# Patient Record
Sex: Female | Born: 1940 | Race: White | Hispanic: No | Marital: Single | Smoking: Never smoker
Health system: Southern US, Community
[De-identification: ages and names within clinical notes are randomized; demographics above are authoritative.]

## PROBLEM LIST (undated history)

## (undated) DIAGNOSIS — H269 Unspecified cataract: Secondary | ICD-10-CM

## (undated) DIAGNOSIS — I10 Essential (primary) hypertension: Secondary | ICD-10-CM

## (undated) DIAGNOSIS — E785 Hyperlipidemia, unspecified: Secondary | ICD-10-CM

## (undated) DIAGNOSIS — K635 Polyp of colon: Secondary | ICD-10-CM

## (undated) DIAGNOSIS — I739 Peripheral vascular disease, unspecified: Secondary | ICD-10-CM

## (undated) DIAGNOSIS — I219 Acute myocardial infarction, unspecified: Secondary | ICD-10-CM

## (undated) DIAGNOSIS — C4490 Unspecified malignant neoplasm of skin, unspecified: Secondary | ICD-10-CM

## (undated) DIAGNOSIS — M1990 Unspecified osteoarthritis, unspecified site: Secondary | ICD-10-CM

## (undated) DIAGNOSIS — I2510 Atherosclerotic heart disease of native coronary artery without angina pectoris: Secondary | ICD-10-CM

## (undated) DIAGNOSIS — Z8619 Personal history of other infectious and parasitic diseases: Secondary | ICD-10-CM

## (undated) DIAGNOSIS — F329 Major depressive disorder, single episode, unspecified: Secondary | ICD-10-CM

## (undated) DIAGNOSIS — E119 Type 2 diabetes mellitus without complications: Secondary | ICD-10-CM

## (undated) DIAGNOSIS — F32A Depression, unspecified: Secondary | ICD-10-CM

## (undated) DIAGNOSIS — N390 Urinary tract infection, site not specified: Secondary | ICD-10-CM

## (undated) HISTORY — DX: Peripheral vascular disease, unspecified: I73.9

## (undated) HISTORY — PX: CHOLECYSTECTOMY: SHX55

## (undated) HISTORY — DX: Major depressive disorder, single episode, unspecified: F32.9

## (undated) HISTORY — PX: CATARACT EXTRACTION: SUR2

## (undated) HISTORY — DX: Essential (primary) hypertension: I10

## (undated) HISTORY — DX: Urinary tract infection, site not specified: N39.0

## (undated) HISTORY — DX: Polyp of colon: K63.5

## (undated) HISTORY — DX: Type 2 diabetes mellitus without complications: E11.9

## (undated) HISTORY — DX: Hyperlipidemia, unspecified: E78.5

## (undated) HISTORY — PX: PERIPHERAL ARTERIAL STENT GRAFT: SHX2220

## (undated) HISTORY — DX: Depression, unspecified: F32.A

## (undated) HISTORY — DX: Personal history of other infectious and parasitic diseases: Z86.19

## (undated) HISTORY — PX: CORONARY ARTERY BYPASS GRAFT: SHX141

## (undated) HISTORY — DX: Unspecified malignant neoplasm of skin, unspecified: C44.90

## (undated) HISTORY — DX: Acute myocardial infarction, unspecified: I21.9

## (undated) HISTORY — DX: Unspecified cataract: H26.9

## (undated) HISTORY — DX: Unspecified osteoarthritis, unspecified site: M19.90

## (undated) HISTORY — DX: Atherosclerotic heart disease of native coronary artery without angina pectoris: I25.10

---

## 1999-11-21 HISTORY — PX: CARDIAC CATHETERIZATION: SHX172

## 2011-11-21 DIAGNOSIS — Z8619 Personal history of other infectious and parasitic diseases: Secondary | ICD-10-CM

## 2011-11-21 HISTORY — DX: Personal history of other infectious and parasitic diseases: Z86.19

## 2013-04-05 ENCOUNTER — Inpatient Hospital Stay: Payer: Self-pay | Admitting: Internal Medicine

## 2013-04-05 LAB — TROPONIN I: Troponin-I: <0.02 ng/mL

## 2013-04-05 LAB — URINALYSIS, COMPLETE
Bilirubin,UR: NEGATIVE
Glucose,UR: 50 mg/dL (ref 0–75)
Hyaline Cast: 1
Nitrite: NEGATIVE
Ph: 6 (ref 4.5–8.0)
RBC,UR: 7 /HPF (ref 0–5)
Specific Gravity: 1.011 (ref 1.003–1.030)
Squamous Epithelial: <1
WBC UR: 225 /HPF (ref 0–5)

## 2013-04-05 LAB — CK TOTAL AND CKMB (NOT AT ARMC): CK, Total: 112 U/L (ref 21–215)

## 2013-04-05 LAB — BASIC METABOLIC PANEL
Anion Gap: 9 (ref 7–16)
BUN: 29 mg/dL — ABNORMAL HIGH (ref 7–18)
Calcium, Total: 8.6 mg/dL (ref 8.5–10.1)
Chloride: 101 mmol/L (ref 98–107)
Co2: 25 mmol/L (ref 21–32)
EGFR (African American): 38 — ABNORMAL LOW
Glucose: 412 mg/dL — ABNORMAL HIGH (ref 65–99)
Potassium: 4.2 mmol/L (ref 3.5–5.1)
Sodium: 135 mmol/L — ABNORMAL LOW (ref 136–145)

## 2013-04-05 LAB — CBC
MCH: 29.8 pg (ref 26.0–34.0)
RDW: 15 % — ABNORMAL HIGH (ref 11.5–14.5)

## 2013-04-10 LAB — CULTURE, BLOOD (SINGLE)

## 2013-04-21 ENCOUNTER — Telehealth: Payer: Self-pay | Admitting: Family Medicine

## 2013-04-21 NOTE — Telephone Encounter (Signed)
Yes ok to accommodate sooner- not last appt of day please.

## 2013-04-21 NOTE — Telephone Encounter (Signed)
Kristina Mahoney DOB 12/05/1974, called on behalf of Kristina Mahoney, a friend who is currently living in her home.  Kristina Mahoney is scheduled for a New Patient appointment with you on 05/22/13 2 pm.  However, Kristina Mahoney is a diabetic and having some issues with a wound on her foot and was seen over the weekend at an Urgent Care facility and is also scheduled at a Wound Center for later this week.    Kristina Mahoney was hoping that you would be able to see [name] earlier than May 22, 2013.  Best number for Kristina Mahoney is [phone].

## 2013-04-23 NOTE — Telephone Encounter (Signed)
Appointment scheduled for 04/29/13.

## 2013-04-29 ENCOUNTER — Encounter: Payer: Self-pay | Admitting: Nurse Practitioner

## 2013-04-29 ENCOUNTER — Encounter: Payer: Self-pay | Admitting: Family Medicine

## 2013-04-29 ENCOUNTER — Ambulatory Visit: Payer: Self-pay | Admitting: Family Medicine

## 2013-04-29 ENCOUNTER — Encounter: Payer: Self-pay | Admitting: Cardiothoracic Surgery

## 2013-05-09 ENCOUNTER — Inpatient Hospital Stay: Payer: Self-pay | Admitting: Student

## 2013-05-09 LAB — URINALYSIS, COMPLETE
Leukocyte Esterase: NEGATIVE
Nitrite: NEGATIVE
Protein: NEGATIVE
RBC,UR: <1 /HPF (ref 0–5)
Specific Gravity: 1.011 (ref 1.003–1.030)
Squamous Epithelial: <1

## 2013-05-09 LAB — CBC
HCT: 33.7 % — ABNORMAL LOW (ref 35.0–47.0)
MCH: 29.4 pg (ref 26.0–34.0)
MCHC: 33.6 g/dL (ref 32.0–36.0)
MCV: 88 fL (ref 80–100)
Platelet: 385 10*3/uL (ref 150–440)
RDW: 16.1 % — ABNORMAL HIGH (ref 11.5–14.5)

## 2013-05-09 LAB — COMPREHENSIVE METABOLIC PANEL
Albumin: 3.5 g/dL (ref 3.4–5.0)
Alkaline Phosphatase: 169 U/L — ABNORMAL HIGH (ref 50–136)
BUN: 30 mg/dL — ABNORMAL HIGH (ref 7–18)
Calcium, Total: 9.6 mg/dL (ref 8.5–10.1)
Creatinine: 1.46 mg/dL — ABNORMAL HIGH (ref 0.60–1.30)
Glucose: 251 mg/dL — ABNORMAL HIGH (ref 65–99)
Potassium: 3.9 mmol/L (ref 3.5–5.1)
SGOT(AST): 27 U/L (ref 15–37)
SGPT (ALT): 20 U/L (ref 12–78)
Total Protein: 8.6 g/dL — ABNORMAL HIGH (ref 6.4–8.2)

## 2013-05-09 LAB — TROPONIN I: Troponin-I: <0.02 ng/mL

## 2013-05-10 LAB — BASIC METABOLIC PANEL
Co2: 25 mmol/L (ref 21–32)
EGFR (Non-African Amer.): 41 — ABNORMAL LOW
Osmolality: 286 (ref 275–301)
Sodium: 140 mmol/L (ref 136–145)

## 2013-05-10 LAB — CBC WITH DIFFERENTIAL/PLATELET
Basophil #: 0.1 10*3/uL (ref 0.0–0.1)
Basophil %: 1.2 %
Eosinophil %: 1.8 %
HCT: 29.3 % — ABNORMAL LOW (ref 35.0–47.0)
HGB: 9.9 g/dL — ABNORMAL LOW (ref 12.0–16.0)
Lymphocyte #: 1.4 10*3/uL (ref 1.0–3.6)
Lymphocyte %: 18.2 %
MCH: 29.3 pg (ref 26.0–34.0)
MCHC: 33.9 g/dL (ref 32.0–36.0)
Monocyte #: 0.7 x10 3/mm (ref 0.2–0.9)
Monocyte %: 9.7 %
Neutrophil #: 5.2 10*3/uL (ref 1.4–6.5)
Neutrophil %: 69.1 %
Platelet: 278 10*3/uL (ref 150–440)
RDW: 15.9 % — ABNORMAL HIGH (ref 11.5–14.5)
WBC: 7.5 10*3/uL (ref 3.6–11.0)

## 2013-05-10 LAB — MAGNESIUM: Magnesium: 2.2 mg/dL

## 2013-05-12 ENCOUNTER — Ambulatory Visit: Payer: Self-pay | Admitting: Family Medicine

## 2013-05-20 ENCOUNTER — Encounter: Payer: Self-pay | Admitting: Cardiothoracic Surgery

## 2013-05-20 ENCOUNTER — Encounter: Payer: Self-pay | Admitting: Nurse Practitioner

## 2013-05-22 ENCOUNTER — Ambulatory Visit: Payer: Self-pay | Admitting: Family Medicine

## 2013-06-02 ENCOUNTER — Ambulatory Visit (INDEPENDENT_AMBULATORY_CARE_PROVIDER_SITE_OTHER): Payer: Medicare Other | Admitting: Family Medicine

## 2013-06-02 ENCOUNTER — Encounter: Payer: Self-pay | Admitting: Family Medicine

## 2013-06-02 VITALS — BP 104/52 | HR 60 | Temp 97.7°F | Wt 158.0 lb

## 2013-06-02 DIAGNOSIS — L89600 Pressure ulcer of unspecified heel, unstageable: Secondary | ICD-10-CM

## 2013-06-02 DIAGNOSIS — E1165 Type 2 diabetes mellitus with hyperglycemia: Secondary | ICD-10-CM

## 2013-06-02 DIAGNOSIS — I25810 Atherosclerosis of coronary artery bypass graft(s) without angina pectoris: Secondary | ICD-10-CM

## 2013-06-02 DIAGNOSIS — L97409 Non-pressure chronic ulcer of unspecified heel and midfoot with unspecified severity: Secondary | ICD-10-CM

## 2013-06-02 DIAGNOSIS — L8995 Pressure ulcer of unspecified site, unstageable: Secondary | ICD-10-CM

## 2013-06-02 DIAGNOSIS — L8990 Pressure ulcer of unspecified site, unspecified stage: Secondary | ICD-10-CM

## 2013-06-02 DIAGNOSIS — E119 Type 2 diabetes mellitus without complications: Secondary | ICD-10-CM

## 2013-06-02 DIAGNOSIS — F32A Depression, unspecified: Secondary | ICD-10-CM

## 2013-06-02 DIAGNOSIS — E1142 Type 2 diabetes mellitus with diabetic polyneuropathy: Secondary | ICD-10-CM

## 2013-06-02 DIAGNOSIS — L89159 Pressure ulcer of sacral region, unspecified stage: Secondary | ICD-10-CM

## 2013-06-02 DIAGNOSIS — L97429 Non-pressure chronic ulcer of left heel and midfoot with unspecified severity: Secondary | ICD-10-CM | POA: Insufficient documentation

## 2013-06-02 DIAGNOSIS — I739 Peripheral vascular disease, unspecified: Secondary | ICD-10-CM | POA: Insufficient documentation

## 2013-06-02 DIAGNOSIS — L89152 Pressure ulcer of sacral region, stage 2: Secondary | ICD-10-CM | POA: Insufficient documentation

## 2013-06-02 DIAGNOSIS — L89609 Pressure ulcer of unspecified heel, unspecified stage: Secondary | ICD-10-CM | POA: Insufficient documentation

## 2013-06-02 DIAGNOSIS — L89109 Pressure ulcer of unspecified part of back, unspecified stage: Secondary | ICD-10-CM

## 2013-06-02 DIAGNOSIS — M1990 Unspecified osteoarthritis, unspecified site: Secondary | ICD-10-CM

## 2013-06-02 DIAGNOSIS — F329 Major depressive disorder, single episode, unspecified: Secondary | ICD-10-CM

## 2013-06-02 DIAGNOSIS — E785 Hyperlipidemia, unspecified: Secondary | ICD-10-CM

## 2013-06-02 DIAGNOSIS — E1159 Type 2 diabetes mellitus with other circulatory complications: Secondary | ICD-10-CM | POA: Insufficient documentation

## 2013-06-02 DIAGNOSIS — N390 Urinary tract infection, site not specified: Secondary | ICD-10-CM | POA: Insufficient documentation

## 2013-06-02 DIAGNOSIS — E1149 Type 2 diabetes mellitus with other diabetic neurological complication: Secondary | ICD-10-CM

## 2013-06-02 LAB — COMPREHENSIVE METABOLIC PANEL
ALT: 10 U/L (ref 0–35)
AST: 17 U/L (ref 0–37)
Albumin: 3.7 g/dL (ref 3.5–5.2)
Alkaline Phosphatase: 132 U/L — ABNORMAL HIGH (ref 39–117)
Potassium: 4.9 mEq/L (ref 3.5–5.1)
Total Protein: 7.3 g/dL (ref 6.0–8.3)

## 2013-06-02 LAB — LIPID PANEL
Cholesterol: 135 mg/dL (ref 0–200)
Total CHOL/HDL Ratio: 3
VLDL: 61.2 mg/dL — ABNORMAL HIGH (ref 0.0–40.0)

## 2013-06-02 MED ORDER — PREGABALIN 50 MG PO CAPS: 50.0000 mg | ORAL_CAPSULE | Freq: Two times a day (BID) | ORAL | Status: DC

## 2013-06-02 MED ORDER — ISOSORBIDE MONONITRATE ER 60 MG PO TB24: ORAL_TABLET | ORAL | Status: DC

## 2013-06-02 MED ORDER — METOPROLOL SUCCINATE ER 25 MG PO TB24: ORAL_TABLET | ORAL | Status: DC

## 2013-06-02 NOTE — Patient Instructions (Addendum)
Nice to meet you. Let's decrease Gabapentin to 300 mg at bedtime every other night for 5 days stop. Go ahead and start the Lyrica- 50 mg twice daily.  We are also decreasing your isosorbide to 60 mg twice daily.  Please stop by to see Shirlee Limerick on your way out to set up your referrals.  We will call you with your lab results.

## 2013-06-02 NOTE — Progress Notes (Signed)
Subjective:    Patient ID: Kristina Mahoney, female    DOB: 02-Aug-1941, 72 y.o.   MRN: [id number]  HPI Pleasant 72 yo female with complicated medical history here to establish care with her daughter.  Moved here from [place] two months ago with her daughter for the warmer weather.  They were hoping maybe it would help with her severe OA.  They have no other family.  Daughter is her caregiver.  Multiple pressure ulcers- followed by [facility].  Just received last note from 05/29/2013 from Cari Caraway, [place]. Bilateral hell ulcers and 1st metatarsal head wound along with sacral decub- currently using Iodoflex to keep all her wounds dry and stable and A & D on right buttock tears. Has home health for wound care.  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  Would like ortho referral to continue cortisone in injections in right knee. Unable to ambulate without a walker currently.  Daughter assists with sponge baths (currently not taking showers due to open wounds). Appetite has been down and losing weight.  H/o PVD- followed by Dr. Festus Barren at [facility].  ABI on left leg is 0.72.  DM- poorly controlled.  Per pt, last a1c 10.  Takes Lantus and Novolog but using sliding scale for both.  DM has been brittle.  Checks CBGs twice weekly- runs between 64-207.  No recent hypoglycemic episodes. Does have diabetic neuropathy.  Was on Gabapentin 300 mg three times daily but told her MD in [place] that it was no longer working.  Per pt daughter, advised to take 300 mg twice daily to wean down but no follow up instructions were given.  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 On Metoprolol 25 mg twice daily, Isosorbide 120 mg every morning and 60 mg in evening, ASA 81 mg daily. Was on Lasix 120 mg every morning and 80 mg in evening but that was stopped when she went to [facility] on 6/20 for weakness and hypotension! LE edema has not so far returned. [facility] notes reviewed.  Cr at  discharge was 1.46.  H/o C. Diff last spring after prolonged hospitalization.  She has been feeling a little "down" lately but she thinks this is due to the move.  Intermittent episodes of situational depression in her life previously and she does not currently feel depressed. Patient Active Problem List   Diagnosis Date Noted  . Poorly controlled diabetes mellitus 06/02/2013  . CAD (coronary artery disease) of artery bypass graft 06/02/2013  . OA (osteoarthritis) 06/02/2013  . PVD (peripheral vascular disease) 06/02/2013  . Pressure ulcer, heel 06/02/2013  . Ulcer of left heel 06/02/2013  . Sacral decubitus ulcer 06/02/2013  . Diabetic peripheral neuropathy 06/02/2013  . Hyperlipidemia   . Depression   . Recurrent UTI    Past Medical History  Diagnosis Date  . Diabetes mellitus without complication   . PVD (peripheral vascular disease)     Followed by Dr. Wyn Quaker.  ABI on left leg is 0.72 (05/2013)  . Arthritis   . CAD (coronary artery disease)   . Colon polyps   . History of Clostridium difficile colitis 2013  . Hypertension   . Hyperlipidemia   . Depression   . Recurrent UTI   . Cataract    Past Surgical History  Procedure Laterality Date  . Cholecystectomy    . Coronary artery bypass graft    . Cataract extraction     History  Substance Use Topics  . Smoking status: Never Smoker   . Smokeless tobacco: Not on file  . Alcohol Use: Not on file   Family History  Problem Relation Age of Onset  . Heart disease Mother   . Heart disease Father    Allergies  Allergen Reactions  . Tegretol (Carbamazepine) Nausea And Vomiting  . Trileptal (Oxcarbazepine) Nausea And Vomiting   No current outpatient prescriptions on file prior to visit.   No current facility-administered medications on file prior to visit.   The PMH, PSH, Social History, Family History, Medications, and allergies have been reviewed in [facility], and have been updated if relevant.      Review of  Systems See HPI No recent CP or SOB No blood in stool- does have h/o colon polyps +decreased appetite  No anxiety No SI or HI     Objective:   Physical Exam  Constitutional: She appears well-developed.  Non-toxic appearance. She has a sickly appearance. No distress.  HENT:  Head: Normocephalic and atraumatic.  Eyes: Conjunctivae and EOM are normal. Pupils are equal, round, and reactive to light.  Neck: No thyromegaly present.  Cardiovascular: Normal rate and regular rhythm.  PMI is not displaced.   Pulmonary/Chest: Effort normal and breath sounds normal.  Abdominal: Normal appearance and normal aorta.  Musculoskeletal:       Legs: Neurological: She is alert. No cranial nerve deficit or sensory deficit.  In wheelchair  Psychiatric: She has a normal mood and affect. Her speech is normal and behavior is normal. Judgment and thought content normal. Cognition and memory are normal.   BP 104/52  Pulse 60  Temp(Src) 97.7 F (36.5 C)  Wt 158 lb (71.668 kg)        Assessment & Plan:  1. Poorly controlled diabetes mellitus Recheck a1c today. I am concerned that she is using a sliding scale for both short and long acting insulin.  I do not feel comfortable with this management or how brittle her DM seems to be. Will refer to endocrinology (Dr. Elvera Lennox). Pt and her daughter agree with this plan. - Ambulatory referral to Endocrinology - Comprehensive metabolic panel - Hemoglobin A1c  2. CAD (coronary artery disease) of artery bypass graft S/p CABG On Metoprolol, ASA and Crestor. Will decrease Isosorbide to 60 mg twice daily given hypotension and dizziness. Also advised not to restart Lasix, certainly not at the doses she was previously taking. Refer to Dr. Mariah Milling as she needs to establish care with local cardiologist. - Ambulatory referral to Cardiology  3. OA (osteoarthritis) End stage.  Refer to ortho for cortisone injections and management. Will also refer for home health  PT. - Ambulatory referral to Orthopedic Surgery - Ambulatory referral to Home Health  4. PVD (peripheral vascular disease) Sees Dr. Wyn Quaker.  5. Pressure ulcer, heel, unspecified laterality, unstageable Followed by home health RN as well as wound care center. Advised increasing protein intake. - Ambulatory referral to Home Health  6. Ulcer of left heel, with unspecified severity See above. - Ambulatory referral to Home Health  7. Sacral decubitus ulcer, unspecified pressure ulcer stage See above.  8. Hyperlipidemia Continue crestor at current dose. Recheck lipid panel and liver function today. - Lipid Panel  9. Depression Currently stable but given home stressors, continue to monitor.  10. Diabetic peripheral neuropathy Poorly controlled.  Wean off gabapentin and start lyrica 50 mg twice daily. Follow up in 1 -2 months. The patient indicates understanding of these issues and agrees with the  plan.

## 2013-06-03 ENCOUNTER — Telehealth: Payer: Self-pay | Admitting: *Deleted

## 2013-06-03 NOTE — Telephone Encounter (Signed)
Patient's daughter advised.  

## 2013-06-03 NOTE — Telephone Encounter (Signed)
Message copied by Annamarie Major on Tue Jun 03, 2013  4:22 PM ------      Message from: Dianne Dun      Created: Tue Jun 03, 2013 12:44 PM       I somehow messed up her result note and now i am not able to reacess it (user error).  Please let pt know that cholesterol looks good, a1c improved at 8.0.  Keep appt with specialists as discussed.      Thanks,      Jovita Gamma ------

## 2013-06-06 ENCOUNTER — Other Ambulatory Visit: Payer: Self-pay

## 2013-06-06 MED ORDER — ROSUVASTATIN CALCIUM 40 MG PO TABS: 40.0000 mg | ORAL_TABLET | Freq: Every day | ORAL | Status: DC

## 2013-06-06 MED ORDER — HYDROCODONE-ACETAMINOPHEN 5-325 MG PO TABS: ORAL_TABLET | ORAL | Status: DC

## 2013-06-06 NOTE — Telephone Encounter (Signed)
pts daughter, Eunice Blase request refill Hydrocodone apap for leg neuropathy and Crestor to [facility]. Dr Dayton Martes has never prescribed before.Please advise.

## 2013-06-06 NOTE — Telephone Encounter (Signed)
[facility] 

## 2013-06-16 ENCOUNTER — Other Ambulatory Visit: Payer: Self-pay

## 2013-06-16 ENCOUNTER — Telehealth: Payer: Self-pay | Admitting: Family Medicine

## 2013-06-16 ENCOUNTER — Encounter: Payer: Self-pay | Admitting: Family Medicine

## 2013-06-16 MED ORDER — GLUCOSE BLOOD VI STRP: ORAL_STRIP | Status: AC

## 2013-06-16 MED ORDER — HYDROCODONE-ACETAMINOPHEN 5-325 MG PO TABS: ORAL_TABLET | ORAL | Status: DC

## 2013-06-16 MED ORDER — GLUCOSE BLOOD VI STRP: ORAL_STRIP | Status: DC

## 2013-06-16 NOTE — Telephone Encounter (Signed)
[facility] faxed request for Contour test strip with directions ck blood sugar 4 x daily as needed.(added to med list)Please advise.

## 2013-06-16 NOTE — Telephone Encounter (Signed)
[name] is asking if patient can have a 30 days supply, #90.  Please advise.

## 2013-06-20 ENCOUNTER — Encounter: Payer: Self-pay | Admitting: Nurse Practitioner

## 2013-06-20 MED ORDER — HYDROCODONE-ACETAMINOPHEN 5-325 MG PO TABS: ORAL_TABLET | ORAL | Status: DC

## 2013-06-20 NOTE — Telephone Encounter (Signed)
Yes

## 2013-06-20 NOTE — Telephone Encounter (Addendum)
Pt left v/m checking on status of Hydrocodone apap refill to [facility]; pt request cb. Was # 90 approved and called to pharmacy?

## 2013-06-20 NOTE — Telephone Encounter (Signed)
OK to call in #90?

## 2013-06-20 NOTE — Addendum Note (Signed)
Addended by: Eliezer Bottom on: 06/20/2013 03:18 PM   Modules accepted: Orders

## 2013-06-20 NOTE — Telephone Encounter (Signed)
Refill called to pharmacy.  Pt advised via my chart.

## 2013-06-23 ENCOUNTER — Ambulatory Visit (INDEPENDENT_AMBULATORY_CARE_PROVIDER_SITE_OTHER): Payer: Medicare Other | Admitting: Cardiovascular Disease

## 2013-06-23 ENCOUNTER — Encounter: Payer: Self-pay | Admitting: Cardiovascular Disease

## 2013-06-23 ENCOUNTER — Telehealth: Payer: Self-pay

## 2013-06-23 VITALS — BP 148/77 | HR 62 | Ht 65.0 in | Wt 158.0 lb

## 2013-06-23 DIAGNOSIS — E785 Hyperlipidemia, unspecified: Secondary | ICD-10-CM

## 2013-06-23 DIAGNOSIS — R0600 Dyspnea, unspecified: Secondary | ICD-10-CM

## 2013-06-23 DIAGNOSIS — I25810 Atherosclerosis of coronary artery bypass graft(s) without angina pectoris: Secondary | ICD-10-CM

## 2013-06-23 DIAGNOSIS — I441 Atrioventricular block, second degree: Secondary | ICD-10-CM

## 2013-06-23 DIAGNOSIS — R0609 Other forms of dyspnea: Secondary | ICD-10-CM

## 2013-06-23 NOTE — Progress Notes (Signed)
HPI  This is a 72 year old female who was referred by Dr. Dayton Martes for management of coronary artery disease. She is accompanied today by her daughter. She moved here from [place] in May in order to live with ay her daughter and be in a warmer weather with the hope of improving her severe OA.  She has multiple chronic medical conditions. She is known to have coronary artery disease and underwent CABG in 2006. She reports having cardiac catheterization done a few years post CABG which showed two out of three grafts to be occluded.  She has been told in the past that have heart condition was not strong enough to tolerate noncardiac surgeries.  She currently denies any chest pain. She does complain of dyspnea and lower extremity edema. Functional capacity is very limited due to severe osteoarthritis. She uses a walker and frequently a wheelchair.   she has multiple pressure ulcers  and nonhealing ulcer on the left foot. She is scheduled for lower extremity angiogram with Dr. Wyn Quaker next week.  She has prolonged history of orally controlled diabetes.  she was hospitalized recently at [facility] for weakness with hypotension. The dose of metoprolol was decreased to 25 mg twice daily. The dose of Imdur was decreased to 60 mg once daily. She was taken off Lasix.  She was on Lasix 120 mg every morning and 80 mg in evening . Cr at discharge was 1.46. She has chronic lower extremity edema worse on the left side with no significant worsening since she was taken off Lasix    Allergies  Allergen Reactions  . Tegretol (Carbamazepine) Nausea And Vomiting  . Trileptal (Oxcarbazepine) Nausea And Vomiting     Current Outpatient Prescriptions on File Prior to Visit  Medication Sig Dispense Refill  . aspirin 81 MG tablet Take 81 mg by mouth daily.      Marland Kitchen glucose blood (BAYER CONTOUR TEST) test strip Check blood sugar 4 x daily as needed.  Dx 250.00  150 each  5  . HYDROcodone-acetaminophen (NORCO/VICODIN) 5-325 MG per tablet Take  one by mouth three times a day  90 tablet  0  . insulin aspart (NOVOLOG) 100 UNIT/ML injection Use sliding scale if glucose over 150      . insulin glargine (LANTUS) 100 UNIT/ML injection Inject 15 units daily      . isosorbide mononitrate (IMDUR) 60 MG 24 hr tablet 1 tablet by mouth twice daily.  60 tablet  3  . metoprolol succinate (TOPROL-XL) 25 MG 24 hr tablet Take one by mouth two times a day  60 tablet  5  . potassium chloride (K-DUR,KLOR-CON) 10 MEQ tablet Take 10 mEq by mouth 2 (two) times daily.      . pregabalin (LYRICA) 50 MG capsule Take 1 capsule (50 mg total) by mouth 2 (two) times daily.  60 capsule  3  . rosuvastatin (CRESTOR) 40 MG tablet Take 1 tablet (40 mg total) by mouth daily.  90 tablet  1   No current facility-administered medications on file prior to visit.     Past Medical History  Diagnosis Date  . Diabetes mellitus without complication   . PVD (peripheral vascular disease)     Followed by Dr. Wyn Quaker.  ABI on left leg is 0.72 (05/2013)  . Arthritis   . CAD (coronary artery disease)   . Colon polyps   . History of Clostridium difficile colitis 2013  . Hypertension   . Hyperlipidemia   . Depression   .  Recurrent UTI   . Cataract   . MI (myocardial infarction)   . Skin cancer      Past Surgical History  Procedure Laterality Date  . Cholecystectomy    . Cataract extraction    . Cardiac catheterization  2001    [place]; [facility];   . Coronary artery bypass graft      [facility] [place]     Family History  Problem Relation Age of Onset  . Heart disease Mother   . Heart disease Father      History   Social History  . Marital Status: Single    Spouse Name: N/A    Number of Children: N/A  . Years of Education: N/A   Occupational History  . Not on file.   Social History Main Topics  . Smoking status: Never Smoker   . Smokeless tobacco: Not on file  . Alcohol Use: No  . Drug Use: No  . Sexually Active: Not on  file   Other Topics Concern  . Not on file   Social History Narrative   Dependent on her daughter for care, including complicated wound dressings.   Daughter seems very attentive.      Desires CPR, does not want long term life support.     ROS 10 point review of system was performed. It's negative other than what's mentioned in the history of present illness.    PHYSICAL EXAM   BP 148/77  Pulse 62  Ht 5\' 5"  (1.651 m)  Wt 158 lb (71.668 kg)  BMI 26.29 kg/m2 Constitutional: She is oriented to person, place, and time. She appears well-developed and well-nourished. No distress.  HENT: No nasal discharge.  Head: Normocephalic and atraumatic.  Eyes: Pupils are equal and round. Right eye exhibits no discharge. Left eye exhibits no discharge.  Neck: Normal range of motion. Neck supple. No JVD present. No thyromegaly present.  Cardiovascular: Normal rate, regular rhythm, normal heart sounds. Exam reveals no gallop and no friction rub. No murmur heard.  Pulmonary/Chest: Effort normal and breath sounds normal. No stridor. No respiratory distress. She has no wheezes. She has no rales. She exhibits no tenderness.  Abdominal: Soft. Bowel sounds are normal. She exhibits no distension. There is no tenderness. There is no rebound and no guarding.  Musculoskeletal: Normal range of motion. She exhibits mild edema worse on the left side and no tenderness.  Neurological: She is alert and oriented to person, place, and time. Coordination normal.  Skin: Skin is warm and dry. Chronic stasis dermatitis. She is not diaphoretic. No erythema. No pallor.  Psychiatric: She has a normal mood and affect. Her behavior is normal. Judgment and thought content normal.     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   ABNORMAL    ASSESSMENT AND PLAN

## 2013-06-23 NOTE — Assessment & Plan Note (Addendum)
She has no symptoms suggestive of angina. Her functional capacity is very poor related to severe osteoarthritis. Continue medical therapy.  I will obtain an echocardiogram to evaluate LV systolic function. She used to be on high-dose Lasix which was discontinued recently due to hypotension and volume depletion. I suspect that she will require at least a smaller dose of Lasix as maintenance.

## 2013-06-23 NOTE — Telephone Encounter (Signed)
Pt daughter called back .Marland KitchenMetroprolol Tarta 50 mg is what pt used to take, now pt is now on Metroprolol Succer 25 mg.

## 2013-06-23 NOTE — Assessment & Plan Note (Signed)
I will likely gradually decrease the dose of Toprol given this finding as well as right bundle branch block and left anterior fascicular block. Currently she seems to be asymptomatic.

## 2013-06-23 NOTE — Assessment & Plan Note (Signed)
Continue lifelong treatment with a statin. Lab Results  Component Value Date   CHOL 135 06/02/2013   HDL 38.60* 06/02/2013   LDLDIRECT 54.5 06/02/2013   TRIG 306.0* 06/02/2013   CHOLHDL 3 06/02/2013

## 2013-06-23 NOTE — Patient Instructions (Addendum)
Your physician has requested that you have an echocardiogram. Echocardiography is a painless test that uses sound waves to create images of your heart. It provides your doctor with information about the size and shape of your heart and how well your heart's chambers and valves are working. This procedure takes approximately one hour. There are no restrictions for this procedure.  Continue same medications but let us know about the kind of Metoprolol you are taking.   Follow up in 3 months.

## 2013-06-25 ENCOUNTER — Other Ambulatory Visit: Payer: Self-pay

## 2013-06-30 ENCOUNTER — Ambulatory Visit: Payer: Self-pay | Admitting: Vascular Surgery

## 2013-06-30 LAB — BASIC METABOLIC PANEL
Anion Gap: 8 (ref 7–16)
BUN: 18 mg/dL (ref 7–18)
Calcium, Total: 9 mg/dL (ref 8.5–10.1)
Chloride: 108 mmol/L — ABNORMAL HIGH (ref 98–107)
Co2: 23 mmol/L (ref 21–32)
Glucose: 229 mg/dL — ABNORMAL HIGH (ref 65–99)
Osmolality: 287 (ref 275–301)

## 2013-07-02 ENCOUNTER — Other Ambulatory Visit: Payer: Self-pay | Admitting: *Deleted

## 2013-07-02 MED ORDER — POTASSIUM CHLORIDE CRYS ER 10 MEQ PO TBCR: 10.0000 meq | EXTENDED_RELEASE_TABLET | Freq: Two times a day (BID) | ORAL | Status: DC

## 2013-07-03 ENCOUNTER — Other Ambulatory Visit: Payer: Self-pay

## 2013-07-03 ENCOUNTER — Other Ambulatory Visit (INDEPENDENT_AMBULATORY_CARE_PROVIDER_SITE_OTHER): Payer: Medicare Other

## 2013-07-03 ENCOUNTER — Other Ambulatory Visit: Payer: Self-pay | Admitting: Family Medicine

## 2013-07-03 DIAGNOSIS — R0989 Other specified symptoms and signs involving the circulatory and respiratory systems: Secondary | ICD-10-CM

## 2013-07-03 DIAGNOSIS — I25810 Atherosclerosis of coronary artery bypass graft(s) without angina pectoris: Secondary | ICD-10-CM

## 2013-07-03 DIAGNOSIS — R0600 Dyspnea, unspecified: Secondary | ICD-10-CM

## 2013-07-04 ENCOUNTER — Encounter: Payer: Self-pay | Admitting: Cardiothoracic Surgery

## 2013-07-08 ENCOUNTER — Telehealth: Payer: Self-pay

## 2013-07-08 NOTE — Telephone Encounter (Signed)
Pt informed of results.

## 2013-07-08 NOTE — Telephone Encounter (Signed)
Message copied by Marilynne Halsted on Tue Jul 08, 2013  8:45 AM ------      Message from: Lorine Bears A      Created: Mon Jul 07, 2013  1:45 PM       Inform patient that echo was fine. Normal EF with only mild mitral regurgitation. ------

## 2013-07-21 ENCOUNTER — Encounter: Payer: Self-pay | Admitting: Cardiothoracic Surgery

## 2013-07-22 ENCOUNTER — Telehealth: Payer: Self-pay | Admitting: *Deleted

## 2013-07-22 NOTE — Telephone Encounter (Signed)
Home health nurse called to ask if ok if she collects a urine on the patient.  Patient is complaining of pain and odor, says she gets about 4 UTI's a year.  I told her ok, she will leave cup for daughter to collect urine with and daughter will bring sample in to office.

## 2013-07-22 NOTE — Telephone Encounter (Signed)
Noted  

## 2013-07-23 ENCOUNTER — Other Ambulatory Visit: Payer: Self-pay | Admitting: Family Medicine

## 2013-07-23 ENCOUNTER — Ambulatory Visit (INDEPENDENT_AMBULATORY_CARE_PROVIDER_SITE_OTHER): Payer: Medicare Other | Admitting: Family Medicine

## 2013-07-23 DIAGNOSIS — R300 Dysuria: Secondary | ICD-10-CM

## 2013-07-23 LAB — POCT URINALYSIS DIPSTICK
Blood, UA: NEGATIVE
Ketones, UA: NEGATIVE
Spec Grav, UA: 1.02
pH, UA: 6

## 2013-07-23 MED ORDER — HYDROCODONE-ACETAMINOPHEN 5-325 MG PO TABS: ORAL_TABLET | ORAL | Status: DC

## 2013-07-24 NOTE — Progress Notes (Signed)
  Subjective:    Patient ID: Kristina Mahoney, female    DOB: [birth year], 72 y.o.   MRN: [id number]  HPI  [name] dropped off urine specimen for UA.  Review of Systems     Objective:   Physical Exam        Assessment & Plan:

## 2013-07-24 NOTE — Telephone Encounter (Signed)
[facility] 

## 2013-07-25 LAB — URINE CULTURE

## 2013-07-28 ENCOUNTER — Other Ambulatory Visit: Payer: Self-pay | Admitting: *Deleted

## 2013-07-28 MED ORDER — CIPROFLOXACIN HCL 250 MG PO TABS: 250.0000 mg | ORAL_TABLET | Freq: Two times a day (BID) | ORAL | Status: DC

## 2013-08-04 ENCOUNTER — Telehealth: Payer: Self-pay

## 2013-08-04 ENCOUNTER — Ambulatory Visit (INDEPENDENT_AMBULATORY_CARE_PROVIDER_SITE_OTHER): Payer: Medicare Other | Admitting: Family Medicine

## 2013-08-04 ENCOUNTER — Encounter: Payer: Self-pay | Admitting: Family Medicine

## 2013-08-04 VITALS — BP 124/64 | HR 61 | Temp 98.3°F | Ht 65.0 in | Wt 184.8 lb

## 2013-08-04 DIAGNOSIS — N390 Urinary tract infection, site not specified: Secondary | ICD-10-CM

## 2013-08-04 LAB — POCT UA - MICROSCOPIC ONLY: Bacteria, U Microscopic: POSITIVE

## 2013-08-04 LAB — POCT URINALYSIS DIPSTICK
Bilirubin, UA: NEGATIVE
Glucose, UA: NEGATIVE
Ketones, UA: NEGATIVE
Nitrite, UA: NEGATIVE

## 2013-08-04 MED ORDER — CIPROFLOXACIN HCL 250 MG PO TABS: 250.0000 mg | ORAL_TABLET | Freq: Two times a day (BID) | ORAL | Status: DC

## 2013-08-04 NOTE — Assessment & Plan Note (Signed)
S/p tx of proteus uti with cipro (5 d course) on 9/3- and symptoms started to re occur 2 d after finishing med  ua dip and micro are pos  Will px cipro 250 bid for 7 days  Pt could not give enough sample for cx today - if her family can bring a specimen back before starting first dose this afternoon they will for cx  Adv to update if worse or not imp in several days

## 2013-08-04 NOTE — Telephone Encounter (Signed)
Kristina Mahoney pts daughter said pt has finished Cipro and pt still has frequency of urine with discomfort when urinates and has h/a and vomited x 1  today. Request refill of antibiotic or should pt be seen. Pt has been hospitalized in the past for UTI x 2. Kristina Mahoney scheduled appt with Dr Milinda Antis today at 12 noon. (Dr Dayton Martes has no available appts today).

## 2013-08-04 NOTE — Patient Instructions (Addendum)
Start back on cipro  If you can bring more urine in for culture before the first dose - please do so  If symptoms worsen- please call  Make sure to drink lots of water

## 2013-08-04 NOTE — Telephone Encounter (Signed)
Will see her then 

## 2013-08-04 NOTE — Progress Notes (Signed)
Subjective:    Patient ID: Kristina Mahoney, female    DOB: 1941-06-04, 72 y.o.   MRN: [id number]  HPI 72 yo pt of Dr Dayton Martes here with urinary symptoms  She had pos ua / cx for proteus on 9/3 and was tx with cipro (this was pan sensitive)  Felt a little better and then she got worse  Sunday night - started feeling nausea and some chills  Also urinating quite often last night  Sugar 151 this am  Some bladder pressure and burning to urinate now  Some flank discomfort on R side  No blood in urine  No hx of kidney stones    Nausea  Vomited when she got out of the car  She does have frequent uti (3-4 a year)   She is done with the cipro-- finished it on Friday   Failed to be able to give specimen today Then tried again and most of urine missed the collection device- just enough to dip test   Patient Active Problem List   Diagnosis Date Noted  . Mobitz type 1 second degree AV block 06/23/2013  . Poorly controlled diabetes mellitus 06/02/2013  . CAD (coronary artery disease) of artery bypass graft 06/02/2013  . OA (osteoarthritis) 06/02/2013  . PVD (peripheral vascular disease) 06/02/2013  . Pressure ulcer, heel 06/02/2013  . Ulcer of left heel 06/02/2013  . Sacral decubitus ulcer 06/02/2013  . Diabetic peripheral neuropathy 06/02/2013  . Hyperlipidemia   . Depression   . Recurrent UTI    Past Medical History  Diagnosis Date  . Diabetes mellitus without complication   . PVD (peripheral vascular disease)     Followed by Dr. Wyn Quaker.  ABI on left leg is 0.72 (05/2013)  . Arthritis   . CAD (coronary artery disease)   . Colon polyps   . History of Clostridium difficile colitis 2013  . Hypertension   . Hyperlipidemia   . Depression   . Recurrent UTI   . Cataract   . MI (myocardial infarction)   . Skin cancer    Past Surgical History  Procedure Laterality Date  . Cholecystectomy    . Cataract extraction    . Cardiac catheterization  2001    [place];   .  Coronary artery bypass graft      [facility] [place]   History  Substance Use Topics  . Smoking status: Never Smoker   . Smokeless tobacco: Not on file  . Alcohol Use: No   Family History  Problem Relation Age of Onset  . Heart disease Mother   . Heart disease Father    Allergies  Allergen Reactions  . Tegretol [Carbamazepine] Nausea And Vomiting  . Trileptal [Oxcarbazepine] Nausea And Vomiting   Current Outpatient Prescriptions on File Prior to Visit  Medication Sig Dispense Refill  . aspirin 81 MG tablet Take 81 mg by mouth daily.      Marland Kitchen glucose blood (BAYER CONTOUR TEST) test strip Check blood sugar 4 x daily as needed.  Dx 250.00  150 each  5  . HYDROcodone-acetaminophen (NORCO/VICODIN) 5-325 MG per tablet Take one by mouth three times a day  90 tablet  0  . insulin aspart (NOVOLOG) 100 UNIT/ML injection Use sliding scale if glucose over 150      . insulin glargine (LANTUS) 100 UNIT/ML injection Inject 15 units daily      . isosorbide mononitrate (IMDUR) 60 MG 24 hr tablet 1 tablet by mouth twice  daily.  60 tablet  3  . metoprolol succinate (TOPROL-XL) 25 MG 24 hr tablet Take one by mouth two times a day  60 tablet  5  . potassium chloride (K-DUR,KLOR-CON) 10 MEQ tablet TAKE 2 TABLETS BY MOUTH EVERY DAY  60 tablet  2  . pregabalin (LYRICA) 50 MG capsule Take 1 capsule (50 mg total) by mouth 2 (two) times daily.  60 capsule  3  . rosuvastatin (CRESTOR) 40 MG tablet Take 1 tablet (40 mg total) by mouth daily.  90 tablet  1   No current facility-administered medications on file prior to visit.    Review of Systems Review of Systems  Constitutional: Negative for fever, appetite change,  and unexpected weight change. pos for fatigue and chills  Eyes: Negative for pain and visual disturbance.  Respiratory: Negative for cough and shortness of breath.   Cardiovascular: Negative for cp or palpitations    Gastrointestinal: Negative for  diarrhea and constipation. neg  for abd pain  Genitourinary: pos for  urgency and frequency. neg for hematuria  Skin: Negative for pallor or rash   Neurological: Negative for weakness, light-headedness, numbness and headaches.  Hematological: Negative for adenopathy. Does not bruise/bleed easily.  Psychiatric/Behavioral: Negative for dysphoric mood. The patient is not nervous/anxious.         Objective:   Physical Exam  Constitutional: She appears well-developed and well-nourished. No distress.  Frail appearing elderly female in a wheelchair  HENT:  Head: Normocephalic and atraumatic.  Mouth/Throat: Oropharynx is clear and moist.  Eyes: Conjunctivae and EOM are normal. Pupils are equal, round, and reactive to light.  Neck: Normal range of motion. Neck supple.  Cardiovascular: Normal rate and regular rhythm.   Pulmonary/Chest: Effort normal and breath sounds normal. No respiratory distress. She has no rales.  Abdominal: Soft. Bowel sounds are normal. She exhibits no distension and no mass. There is tenderness in the suprapubic area. There is CVA tenderness. There is no rebound and no guarding.  Very slight R flank tenderness   Musculoskeletal:  L foot is wrapped in a bandage   Lymphadenopathy:    She has no cervical adenopathy.  Neurological: She is alert.  Skin: Skin is warm and dry. No rash noted.  Psychiatric: She has a normal mood and affect.          Assessment & Plan:

## 2013-08-06 ENCOUNTER — Ambulatory Visit: Payer: Medicare Other

## 2013-08-08 ENCOUNTER — Emergency Department: Payer: Self-pay | Admitting: Emergency Medicine

## 2013-08-08 LAB — BASIC METABOLIC PANEL
Anion Gap: 7 (ref 7–16)
BUN: 16 mg/dL (ref 7–18)
Calcium, Total: 9.1 mg/dL (ref 8.5–10.1)
Chloride: 105 mmol/L (ref 98–107)
Creatinine: 0.89 mg/dL (ref 0.60–1.30)
EGFR (Non-African Amer.): >60
Glucose: 212 mg/dL — ABNORMAL HIGH (ref 65–99)
Osmolality: 278 (ref 275–301)

## 2013-08-08 LAB — URINALYSIS, COMPLETE
Bilirubin,UR: NEGATIVE
Ph: 5 (ref 4.5–8.0)
Protein: 100
RBC,UR: 106 /HPF (ref 0–5)

## 2013-08-08 LAB — CBC
HCT: 38.8 % (ref 35.0–47.0)
MCHC: 33.7 g/dL (ref 32.0–36.0)
MCV: 90 fL (ref 80–100)
RBC: 4.3 10*6/uL (ref 3.80–5.20)
WBC: 9.6 10*3/uL (ref 3.6–11.0)

## 2013-08-12 ENCOUNTER — Telehealth: Payer: Self-pay

## 2013-08-12 NOTE — Telephone Encounter (Signed)
Christy nurse with [facility] left v/m pt was seen 08/04/13 with ongoing UTI; pt no better, no appetite,very nauseated, no fever; Christy request cb.

## 2013-08-12 NOTE — Telephone Encounter (Signed)
Left message for pt to return call.

## 2013-08-12 NOTE — Telephone Encounter (Signed)
It looks like she was given a longer course of cipro by Dr. Milinda Antis.  No urine cx (Perhaps inadequate specimen).  Could she leave another sample for [place] or come back to be evaluated?

## 2013-08-13 ENCOUNTER — Ambulatory Visit (INDEPENDENT_AMBULATORY_CARE_PROVIDER_SITE_OTHER): Payer: Medicare Other | Admitting: Internal Medicine

## 2013-08-13 ENCOUNTER — Encounter: Payer: Self-pay | Admitting: Internal Medicine

## 2013-08-13 VITALS — BP 112/60 | HR 71 | Temp 97.4°F | Resp 12 | Ht 65.0 in | Wt 136.0 lb

## 2013-08-13 DIAGNOSIS — E1159 Type 2 diabetes mellitus with other circulatory complications: Secondary | ICD-10-CM

## 2013-08-13 NOTE — Patient Instructions (Addendum)
Please use the following insulin regimen: - Lantus 15 units at night - Novolog before every meal (2x a day): - 3 units with a small meal - 4 units with a larger meal - Add the following Sliding scale: - 150-175: + 1 unit  - 176-200: + 2 units  - >200: + 3 units  Pleae check sugars 2-4 x a day and write them down in the log. Please return in 1 month with your sugar log.   PATIENT INSTRUCTIONS FOR TYPE 2 DIABETES:  DIET AND EXERCISE Diet and exercise is an important part of diabetic treatment.  We recommended aerobic exercise in the form of brisk walking (working between 40-60% of maximal aerobic capacity, similar to brisk walking) for 150 minutes per week (such as 30 minutes five days per week) along with 3 times per week performing 'resistance' training (using various gauge rubber tubes with handles) 5-10 exercises involving the major muscle groups (upper body, lower body and core) performing 10-15 repetitions (or near fatigue) each exercise. Start at half the above goal but build slowly to reach the above goals. If limited by weight, joint pain, or disability, we recommend daily walking in a swimming pool with water up to waist to reduce pressure from joints while allow for adequate exercise.    BLOOD GLUCOSES Monitoring your blood glucoses is important for continued management of your diabetes. Please check your blood glucoses 2-4 times a day: fasting, before meals and at bedtime (you can rotate these measurements - e.g. one day check before the 3 meals, the next day check before 2 of the meals and before bedtime, etc.   HYPOGLYCEMIA (low blood sugar) Hypoglycemia is usually a reaction to not eating, exercising, or taking too much insulin/ other diabetes drugs.  Symptoms include tremors, sweating, hunger, confusion, headache, etc. Treat IMMEDIATELY with 15 grams of Carbs:   4 glucose tablets    cup regular juice/soda   2 tablespoons raisins   4 teaspoons sugar   1 tablespoon  honey Recheck blood glucose in 15 mins and repeat above if still symptomatic/blood glucose <100. Please contact our office at [phone] if you have questions about how to next handle your insulin.  RECOMMENDATIONS TO REDUCE YOUR RISK OF DIABETIC COMPLICATIONS: * Take your prescribed MEDICATION(S). * Follow a DIABETIC diet: Complex carbs, fiber rich foods, heart healthy fish twice weekly, (monounsaturated and polyunsaturated) fats * AVOID saturated/trans fats, high fat foods, >2,300 mg salt per day. * EXERCISE at least 5 times a week for 30 minutes or preferably daily.  * DO NOT SMOKE OR DRINK more than 1 drink a day. * Check your FEET every day. Do not wear tightfitting shoes. Contact us if you develop an ulcer * See your EYE doctor once a year or more if needed * Get a FLU shot once a year * Get a PNEUMONIA vaccine once before and once after age 17 years  GOALS:  * Your Hemoglobin A1c of <7%  * fasting sugars need to be <130 * after meals sugars need to be <180 (2h after you start eating) * Your Systolic BP should be 140 or lower  * Your Diastolic BP should be 80 or lower  * Your HDL (Good Cholesterol) should be 40 or higher  * Your LDL (Bad Cholesterol) should be 100 or lower  * Your Triglycerides should be 150 or lower  * Your Urine microalbumin (kidney function) should be <30 * Your Body Mass Index should be 25 or lower  We will be glad to help you achieve these goals. Our telephone number is: [phone].

## 2013-08-13 NOTE — Progress Notes (Signed)
Patient ID: Kristina Mahoney, female   DOB: [birth year], 72 y.o.   MRN: [id number]  HPI: Kristina Mahoney is a 72 y.o.-year-old female, referred by her PCP, Dr. Dayton Mahoney, for management of DM2, insulin-dependent, uncontrolled, with complications (CAD - s/p CABG, PVD, PN). Her daughter accompanies her today and offer part of the history.   Patient has been diagnosed with diabetes in 1992; she has started on insulin right at dx. Last hemoglobin A1c was: Lab Results  Component Value Date   HGBA1C 8.0* 06/02/2013    She has had a UTI for the last 2 weeks, could not eat well. She is on ABx.  She had a steroid R knee injection 2 mo ago. Had a total of 2 inj in last 2 years.   Pt is on a regimen of: - Lantus 10 or 15 units qhs - Novolog SSI  150-200: + 2 units 201-250: + 4 251-300: + 6 (not usually) She was not on oral diabetic meds in the past.  Pt checks her sugars 2x a day and they are: - am: 130-150 - bedtime: 170-200, occasionally higher (349 >> took 51 units of Lantus) No lows <100 in last year; her target is actually >120;  she has hypoglycemia awareness at 100.  Pt's meals are (not : - Breakfast (11 am): scrambled egg, glucerna shake - not 1 whole can; cheerios + vanilla soy milk - Lunch: nothing - Dinner: chicken + vegetales - Snacks: 1 a day - peaches, sugar-free jello  - no CKD, last BUN/creatinine:  Lab Results  Component Value Date   BUN 10 06/02/2013   CREATININE 0.8 06/02/2013  Not on ACEI/ARB.  - Has HL. last set of lipids: Lab Results  Component Value Date   CHOL 135 06/02/2013   HDL 38.60* 06/02/2013   LDLDIRECT 54.5 06/02/2013   TRIG 306.0* 06/02/2013   CHOLHDL 3 06/02/2013  On Crestor 40. On ASA 81. - last eye exam was in 07/2012. + DR. Had cataract sx on both eyes. - + numbness and tingling + pain in her feet. On Lyrica, previously on Gabapentin. She has an ulcer on the back of the left heel - [facility]   Pt has FH of DM2 in 2  aunts.  ROS: Constitutional: + weight loss, + fatigue, no subjective hyperthermia/hypothermia; burning with urination (resolving UTI) Eyes: + blurry vision, no xerophthalmia ENT: no sore throat, no nodules palpated in throat, no dysphagia/odynophagia, no hoarseness; + decreased hearing Cardiovascular: no CP/+SOB/palpitations/+ leg swelling Respiratory: no cough/+SOB Gastrointestinal: +N/+V/no D/C Musculoskeletal: no muscle/+ joint aches Skin: + rash legs; + easy bruising, + hair loss Neurological: no tremors/numbness/tingling/dizziness, + HA, Psychiatric: +depression/no anxiety  Past Medical History  Diagnosis Date  . Diabetes mellitus without complication   . PVD (peripheral vascular disease)     Followed by Dr. Wyn Mahoney.  ABI on left leg is 0.72 (05/2013)  . Arthritis   . CAD (coronary artery disease)   . Colon polyps   . History of Clostridium difficile colitis 2013  . Hypertension   . Hyperlipidemia   . Depression   . Recurrent UTI   . Cataract   . MI (myocardial infarction)   . Skin cancer    Past Surgical History  Procedure Laterality Date  . Cholecystectomy    . Cataract extraction    . Cardiac catheterization  2001    [place];   . Coronary artery bypass graft      [facility] [place]   History   Social History  . Marital Status: Single, Divorced    Spouse Name: N/A    Number of Children: 1, 87 y/o   . retired   Social History Main Topics  . Smoking status: Never Smoker   . Smokeless tobacco: Not on file  . Alcohol Use: No  . Drug Use: No   Social History Narrative   Dependent on her daughter for care, including complicated wound dressings.   Daughter seems very attentive.      Desires CPR, does not want long term life support.   Current Outpatient Prescriptions on File Prior to Visit  Medication Sig Dispense Refill  . aspirin 81 MG tablet Take 81 mg by mouth daily.      Marland Kitchen glucose blood (BAYER CONTOUR TEST) test  strip Check blood sugar 4 x daily as needed.  Dx 250.00  150 each  5  . HYDROcodone-acetaminophen (NORCO/VICODIN) 5-325 MG per tablet Take one by mouth three times a day  90 tablet  0  . insulin aspart (NOVOLOG) 100 UNIT/ML injection Use sliding scale if glucose over 150      . insulin glargine (LANTUS) 100 UNIT/ML injection Inject 15 units daily      . isosorbide mononitrate (IMDUR) 60 MG 24 hr tablet 1 tablet by mouth twice daily.  60 tablet  3  . metoprolol succinate (TOPROL-XL) 25 MG 24 hr tablet Take one by mouth two times a day  60 tablet  5  . potassium chloride (K-DUR,KLOR-CON) 10 MEQ tablet TAKE 2 TABLETS BY MOUTH EVERY DAY  60 tablet  2  . pregabalin (LYRICA) 50 MG capsule Take 1 capsule (50 mg total) by mouth 2 (two) times daily.  60 capsule  3  . rosuvastatin (CRESTOR) 40 MG tablet Take 1 tablet (40 mg total) by mouth daily.  90 tablet  1  . ciprofloxacin (CIPRO) 250 MG tablet Take 1 tablet (250 mg total) by mouth 2 (two) times daily.  14 tablet  0   No current facility-administered medications on file prior to visit.   Allergies  Allergen Reactions  . Tegretol [Carbamazepine] Nausea And Vomiting  . Trileptal [Oxcarbazepine] Nausea And Vomiting   Family History  Problem Relation Age of Onset  . Heart disease Mother   . Heart disease Father    PE: BP 112/60  Pulse 71  Temp(Src) 97.4 F (36.3 C) (Oral)  Resp 12  Ht 5\' 5"  (1.651 m)  Wt 136 lb (61.689 kg)  BMI 22.63 kg/m2  SpO2 97% Wt Readings from Last 3 Encounters:  08/13/13 136 lb (61.689 kg)  08/04/13 184 lb 12 oz (83.802 kg)  06/23/13 158 lb (71.668 kg)   Constitutional: overweight, in NAD, in wheelchair Eyes: PERRLA, EOMI, no exophthalmos ENT: moist mucous membranes, no thyromegaly, no cervical lymphadenopathy Cardiovascular: RRR, No MRG Respiratory: CTA B Gastrointestinal: abdomen soft, NT, ND, BS+ Musculoskeletal: no deformities, strength intact in all 4 Skin: moist, warm, erythema bilateral lower legs  (stasis dermatosis) Neurological: no tremor with outstretched hands, DTR normal in all 4 Could not examine left foot >> bandaged  ASSESSMENT: 1. DM2, insulin-dependent, uncontrolled, with complications -  CAD - s/p CABG 2006 - PVD - PN - Lyrica  PLAN:  1. Patient with long-standing, recently more controlled diabetes due to decreased food intake, on insulin regimen, with an imbalance between basal insulin and rapid insulin dose. As a consequence, she has a staircase effect of increasing sugars toward the end of  the day, a sign of not enough mealtime coverage. - We discussed about options for treatment, and I suggested to:  - Keep Lantus at 15 units at night - Novolog before every meal (2x a day): - 3 units with a small meal - 4 units with a larger meal - Add the following Sliding scale: - 150-175: + 1 unit  - 176-200: + 2 units  - >200: + 3 units  - at next visit, I would like to see if we can use Januvia (no h/o pancreatitis), for e.g., to cover her meals, since her HbA1c is not far from goal and she does not require large doses of insulin. Metformin is also an option, but she has a h/o Cdiff and has diarrhea on and off since 2 years ago, so she might not tolerate it well. - Strongly advised her to start checking her sugars at different times of the day - check 2-4 times a day, rotating checks - given sugar log and advised how to fill it and to bring it at next appt  - given foot care handout and explained the principles  - given instructions for hypoglycemia management "15-15 rule"  - advised for yearly eye exams - she will establish care with a podiatrist in the area - Return to clinic in one month with sugar log

## 2013-08-13 NOTE — Telephone Encounter (Signed)
Kristina Mahoney said that they ended up taking pt to hospital and her abx was changed and since then pt is doing a lot better

## 2013-08-19 ENCOUNTER — Telehealth: Payer: Self-pay

## 2013-08-19 NOTE — Telephone Encounter (Signed)
Please advise her to call Dr. Kirke Corin, her cardiologist.  It sounds like she needs to be evaluated given her history.

## 2013-08-19 NOTE — Telephone Encounter (Signed)
Spoke w/ Selena Batten from [facility].  States that Dr. Dayton Martes told her to call Dr. Jari Sportsman office. States that pt's heart rate was 39 bpm this morning when she arrived.  After doing dressing change and stimulating pt, hr came up to 64. SpO2 dropped to 88%, but was climbing slowly to 91% on room air. She has not checked pt's BP. Pt states that she "feels fine". Instructed Kim to hold metoprolol until she hears back from [place] with Dr. Jari Sportsman recommendation. [name] will staying with her today to keep an eye on her and monitor her heart rate.

## 2013-08-19 NOTE — Telephone Encounter (Signed)
Spoke w/ Debbie, pt's daughter, who said that pt is feeling better, but still a little tired.   States that the last time she checked, pt's heart rate was 65, but she "doesn't have a machine" to verify.   She verbalizes understanding to hold metoprolol and to wait to hear from [facility] about a 48 hr holter monitor. She will have the home health nurse monitor heart rate.

## 2013-08-19 NOTE — Telephone Encounter (Signed)
Stop Metoprolol. I want her to have a 48 hr Holter for bradycardia.

## 2013-08-19 NOTE — Telephone Encounter (Signed)
Christie notified as instructed and she will contact Dr Jari Sportsman office.

## 2013-08-19 NOTE — Telephone Encounter (Signed)
Christie nurse with [facility] is at pts home; pt is still in bed and just woke up P 39 BP 130/80. Pulse ox 95 %. Now P is 38-43. Slightly irreg. Pt pulse usually irreg 52-68. No complaints, no CP, SOB, dizziness. Pt said she feels cold to hot but no fever.

## 2013-08-20 ENCOUNTER — Other Ambulatory Visit: Payer: Self-pay

## 2013-08-20 ENCOUNTER — Encounter: Payer: Self-pay | Admitting: Cardiothoracic Surgery

## 2013-08-20 DIAGNOSIS — R001 Bradycardia, unspecified: Secondary | ICD-10-CM

## 2013-08-21 ENCOUNTER — Other Ambulatory Visit: Payer: Self-pay | Admitting: Family Medicine

## 2013-08-21 MED ORDER — HYDROCODONE-ACETAMINOPHEN 5-325 MG PO TABS: ORAL_TABLET | ORAL | Status: DC

## 2013-08-22 ENCOUNTER — Telehealth: Payer: Self-pay

## 2013-08-22 NOTE — Telephone Encounter (Signed)
[name] nurse with [facility] said pt finished med 08/18/13 or 08/19/13 and pt wants to bring urine specimen by office this afternoon due to continued weakness, slight burn upon urination; no fever. Cathy request cb.

## 2013-08-22 NOTE — Telephone Encounter (Signed)
Ok to bring Urine specimen.

## 2013-08-22 NOTE — Telephone Encounter (Signed)
Called into pharmacy

## 2013-08-22 NOTE — Telephone Encounter (Signed)
Spoke with Lynden Ang, nurse with [facility].  She will pass info on to pt's daughter and will have her bring urine before 5 today or on Monday.

## 2013-08-25 ENCOUNTER — Other Ambulatory Visit: Payer: Self-pay | Admitting: Family Medicine

## 2013-08-25 DIAGNOSIS — N390 Urinary tract infection, site not specified: Secondary | ICD-10-CM

## 2013-08-25 DIAGNOSIS — R001 Bradycardia, unspecified: Secondary | ICD-10-CM

## 2013-08-26 LAB — URINE CULTURE: Colony Count: >=100000

## 2013-08-27 NOTE — Addendum Note (Signed)
Addended by: Alvina Chou on: 08/27/2013 04:10 PM   Modules accepted: Orders

## 2013-08-28 LAB — URINALYSIS, MICROSCOPIC ONLY
Casts: NONE SEEN
Crystals: NONE SEEN

## 2013-08-28 LAB — URINALYSIS, ROUTINE W REFLEX MICROSCOPIC
Bilirubin Urine: NEGATIVE
Glucose, UA: NEGATIVE mg/dL
Protein, ur: NEGATIVE mg/dL

## 2013-08-29 ENCOUNTER — Other Ambulatory Visit: Payer: Self-pay | Admitting: Family Medicine

## 2013-08-29 LAB — URINE CULTURE: Colony Count: >=100000

## 2013-08-29 MED ORDER — FLUCONAZOLE 200 MG PO TABS: 200.0000 mg | ORAL_TABLET | Freq: Every day | ORAL | Status: AC

## 2013-09-03 ENCOUNTER — Telehealth: Payer: Self-pay

## 2013-09-03 NOTE — Telephone Encounter (Signed)
Lmom for pt to call back regarding holter results.

## 2013-09-05 ENCOUNTER — Telehealth: Payer: Self-pay | Admitting: *Deleted

## 2013-09-05 NOTE — Telephone Encounter (Signed)
Spoke w/ pt's daughter, Kristina Mahoney.  She states that pt's heartrate has consistently been low x 2 weeks.  Pt has not taken metoprolol since 9/30.  She has been experiencing new onset sob w/ exertion since that time.  Denies chest pain or dizziness.  SOB resolves with rest.  Pt has f/u in November, but daughter would like her to be seen sooner. Offered pt appt w/ Alinda Money, PA this afternoon, but she prefers to see Dr. Kirke Corin.  Pt sched to see Dr. Layla Barter 10/20 @ 2:30. Daughter verbalizes understanding to call 911 or take pt to ED if symptoms worsen.

## 2013-09-05 NOTE — Telephone Encounter (Signed)
Please call patient's daughter her hr is in low 50's , she is also having sob. Please advise

## 2013-09-08 ENCOUNTER — Other Ambulatory Visit: Payer: Self-pay

## 2013-09-08 ENCOUNTER — Ambulatory Visit (INDEPENDENT_AMBULATORY_CARE_PROVIDER_SITE_OTHER): Payer: Medicare Other | Admitting: Cardiovascular Disease

## 2013-09-08 ENCOUNTER — Encounter: Payer: Self-pay | Admitting: Cardiovascular Disease

## 2013-09-08 VITALS — BP 70/0 | HR 87 | Ht 66.0 in | Wt 170.5 lb

## 2013-09-08 DIAGNOSIS — I441 Atrioventricular block, second degree: Secondary | ICD-10-CM

## 2013-09-08 DIAGNOSIS — I959 Hypotension, unspecified: Secondary | ICD-10-CM

## 2013-09-08 DIAGNOSIS — I25810 Atherosclerosis of coronary artery bypass graft(s) without angina pectoris: Secondary | ICD-10-CM

## 2013-09-08 DIAGNOSIS — R001 Bradycardia, unspecified: Secondary | ICD-10-CM

## 2013-09-08 LAB — COMPREHENSIVE METABOLIC PANEL
Albumin: 3.5 g/dL (ref 3.4–5.0)
BUN: 20 mg/dL — ABNORMAL HIGH (ref 7–18)
Calcium, Total: 9.2 mg/dL (ref 8.5–10.1)
Chloride: 109 mmol/L — ABNORMAL HIGH (ref 98–107)
Creatinine: 1.05 mg/dL (ref 0.60–1.30)
EGFR (Non-African Amer.): 53 — ABNORMAL LOW
Glucose: 70 mg/dL (ref 65–99)
Osmolality: 280 (ref 275–301)
SGPT (ALT): 15 U/L (ref 12–78)
Total Protein: 7.3 g/dL (ref 6.4–8.2)

## 2013-09-08 LAB — URINALYSIS, COMPLETE
Glucose,UR: NEGATIVE mg/dL (ref 0–75)
Ketone: NEGATIVE
Nitrite: NEGATIVE
Ph: 5 (ref 4.5–8.0)
Specific Gravity: 1.014 (ref 1.003–1.030)
Squamous Epithelial: 7

## 2013-09-08 LAB — CBC
HCT: 38 % (ref 35.0–47.0)
HGB: 12.8 g/dL (ref 12.0–16.0)
MCV: 90 fL (ref 80–100)
RBC: 4.21 10*6/uL (ref 3.80–5.20)
RDW: 13.8 % (ref 11.5–14.5)
WBC: 7.7 10*3/uL (ref 3.6–11.0)

## 2013-09-08 LAB — TROPONIN I: Troponin-I: <0.02 ng/mL

## 2013-09-08 LAB — CK TOTAL AND CKMB (NOT AT ARMC): CK, Total: 79 U/L (ref 21–215)

## 2013-09-08 NOTE — Progress Notes (Signed)
Primary care physician: Dr. Dayton Martes  HPI  This is a 72 year old female who is here today for a followup visit regarding coronary artery disease and bradycardia.  She is accompanied today by her daughter. She moved here from [place] in May in order to live with ay her daughter and be in a warmer weather with the hope of improving her severe OA.  She has multiple chronic medical conditions. She is known to have coronary artery disease and underwent CABG in 2006. She reports having cardiac catheterization done a few years post CABG which showed two out of three grafts to be occluded.  She has been told in the past that have heart condition was not strong enough to tolerate noncardiac surgeries.   Functional capacity is very limited due to severe osteoarthritis. She uses a walker and frequently a wheelchair.  She has multiple pressure ulcers  and nonhealing ulcer on the left foot.  She has prolonged history of  diabetes.  she was hospitalized in July at [facility] for weakness with hypotension. The dose of metoprolol was decreased to 25 mg twice daily. The dose of Imdur was decreased to 60 mg once daily. She was taken off Lasix.   They called our office recently due to bradycardia. Thus, I stop metoprolol. She underwent a Holter monitor which showed sinus rhythm with occasional PACs and PVCs. Average heart rate was 69 beats per minute with frequent pauses at night but less than 2 seconds in duration. She had a recent UTI which has been difficult to treat. She has been feeling nauseous with very poor appetite. She vomited yesterday. Today she feels extremely weak and lightheaded. She is noted to be hypotensive.  Allergies  Allergen Reactions  . Tegretol [Carbamazepine] Nausea And Vomiting  . Trileptal [Oxcarbazepine] Nausea And Vomiting     Current Outpatient Prescriptions on File Prior to Visit  Medication Sig Dispense Refill  . aspirin 81 MG tablet Take 81 mg by mouth daily.      . fluconazole (DIFLUCAN) 200 MG  tablet Take 1 tablet (200 mg total) by mouth daily.  14 tablet  0  . glucose blood (BAYER CONTOUR TEST) test strip Check blood sugar 4 x daily as needed.  Dx 250.00  150 each  5  . HYDROcodone-acetaminophen (NORCO/VICODIN) 5-325 MG per tablet Take one by mouth three times a day  90 tablet  0  . insulin aspart (NOVOLOG) 100 UNIT/ML injection Use sliding scale if glucose over 150      . insulin glargine (LANTUS) 100 UNIT/ML injection Inject 15 units daily      . isosorbide mononitrate (IMDUR) 60 MG 24 hr tablet 1 tablet by mouth twice daily.  60 tablet  3  . nitrofurantoin (MACRODANTIN) 100 MG capsule Take 100 mg by mouth 2 (two) times daily.      . potassium chloride (K-DUR,KLOR-CON) 10 MEQ tablet TAKE 2 TABLETS BY MOUTH EVERY DAY  60 tablet  2  . pregabalin (LYRICA) 50 MG capsule Take 1 capsule (50 mg total) by mouth 2 (two) times daily.  60 capsule  3  . rosuvastatin (CRESTOR) 40 MG tablet Take 1 tablet (40 mg total) by mouth daily.  90 tablet  1   No current facility-administered medications on file prior to visit.     Past Medical History  Diagnosis Date  . Diabetes mellitus without complication   . PVD (peripheral vascular disease)     Followed by Dr. Wyn Quaker.  ABI on left leg is 0.72 (05/2013)  .  Arthritis   . CAD (coronary artery disease)   . Colon polyps   . History of Clostridium difficile colitis 2013  . Hypertension   . Hyperlipidemia   . Depression   . Recurrent UTI   . Cataract   . MI (myocardial infarction)   . Skin cancer   . UTI (urinary tract infection)      Past Surgical History  Procedure Laterality Date  . Cholecystectomy    . Cataract extraction    . Cardiac catheterization  2001    [place];   . Coronary artery bypass graft      [facility] [place]  . Peripheral arterial stent graft       Family History  Problem Relation Age of Onset  . Heart disease Mother   . Heart disease Father      History   Social  History  . Marital Status: Single    Spouse Name: N/A    Number of Children: N/A  . Years of Education: N/A   Occupational History  . Not on file.   Social History Main Topics  . Smoking status: Never Smoker   . Smokeless tobacco: Not on file  . Alcohol Use: No  . Drug Use: No  . Sexual Activity: Not on file   Other Topics Concern  . Not on file   Social History Narrative   Dependent on her daughter for care, including complicated wound dressings.   Daughter seems very attentive.      Desires CPR, does not want long term life support.     ROS 10 point review of system was performed. It's negative other than what's mentioned in the history of present illness.    PHYSICAL EXAM   BP 70/0  Pulse 87  Ht 5\' 6"  (1.676 m)  Wt 170 lb 8 oz (77.338 kg)  BMI 27.53 kg/m2 Constitutional: She is oriented to person, place, and time. She appears well-developed and well-nourished. No distress.  HENT: No nasal discharge.  Head: Normocephalic and atraumatic.  Eyes: Pupils are equal and round. Right eye exhibits no discharge. Left eye exhibits no discharge.  Neck: Normal range of motion. Neck supple. No JVD present. No thyromegaly present.  Cardiovascular: Normal rate, regular rhythm, normal heart sounds. Exam reveals no gallop and no friction rub. No murmur heard.  Pulmonary/Chest: Effort normal and breath sounds normal. No stridor. No respiratory distress. She has no wheezes. She has no rales. She exhibits no tenderness.  Abdominal: Soft. Bowel sounds are normal. She exhibits no distension. There is no tenderness. There is no rebound and no guarding.  Musculoskeletal: Normal range of motion. She exhibits mild edema worse on the left side and no tenderness.  Neurological: She is alert and oriented to person, place, and time. Coordination normal.  Skin: Skin is warm and dry. Chronic stasis dermatitis. She is not diaphoretic. No erythema. No pallor.  Psychiatric: She has a normal mood  and affect. Her behavior is normal. Judgment and thought content normal.     AVW:UJWJX  Rhythm  -Second degree A-V block  (Type II)  Low voltage in precordial leads.   Voltage criteria for LVH  (R(aVL) exceeds 1.01 mV)  -Voltage criteria w/o ST/T abnormality may be normal.   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     ASSESSMENT AND PLAN

## 2013-09-08 NOTE — Assessment & Plan Note (Addendum)
I suspect that this is likely due to volume depletion. The patient is extremely weak. I recommend transferring to the emergency room for evaluation. She also had a recent urinary tract infection and will have to make sure that she is not septic. Hold Imdur.

## 2013-09-08 NOTE — Assessment & Plan Note (Signed)
No symptoms of angina. Continue medical therapy. 

## 2013-09-08 NOTE — Patient Instructions (Signed)
Transfer to the ER.

## 2013-09-08 NOTE — Assessment & Plan Note (Signed)
Recent bradycardia improved after stopping metoprolol. She does have evidence of Mobitz 2 second degree AV block today. If she continues to be symptomatic after correcting hypotension, there might be an indication for a permanent pacemaker.

## 2013-09-09 ENCOUNTER — Inpatient Hospital Stay: Payer: Self-pay | Admitting: Internal Medicine

## 2013-09-10 ENCOUNTER — Ambulatory Visit: Payer: Medicare Other | Admitting: Internal Medicine

## 2013-09-10 LAB — CBC WITH DIFFERENTIAL/PLATELET
Basophil #: 0 10*3/uL (ref 0.0–0.1)
Basophil %: 0.8 %
Eosinophil #: 0.4 10*3/uL (ref 0.0–0.7)
Eosinophil %: 6.9 %
HGB: 11.2 g/dL — ABNORMAL LOW (ref 12.0–16.0)
Lymphocyte #: 0.9 10*3/uL — ABNORMAL LOW (ref 1.0–3.6)
Lymphocyte %: 16.3 %
MCH: 30.5 pg (ref 26.0–34.0)
MCHC: 34 g/dL (ref 32.0–36.0)
MCV: 90 fL (ref 80–100)
Monocyte #: 0.5 x10 3/mm (ref 0.2–0.9)
Monocyte %: 8.1 %
Neutrophil %: 67.9 %
Platelet: 169 10*3/uL (ref 150–440)
RBC: 3.66 10*6/uL — ABNORMAL LOW (ref 3.80–5.20)
RDW: 14.3 % (ref 11.5–14.5)

## 2013-09-10 LAB — BASIC METABOLIC PANEL
Anion Gap: 8 (ref 7–16)
Calcium, Total: 8.3 mg/dL — ABNORMAL LOW (ref 8.5–10.1)
Co2: 22 mmol/L (ref 21–32)
Creatinine: 0.99 mg/dL (ref 0.60–1.30)
EGFR (African American): >60
EGFR (Non-African Amer.): 57 — ABNORMAL LOW
Glucose: 134 mg/dL — ABNORMAL HIGH (ref 65–99)
Osmolality: 284 (ref 275–301)
Potassium: 3.5 mmol/L (ref 3.5–5.1)
Sodium: 142 mmol/L (ref 136–145)

## 2013-09-11 ENCOUNTER — Encounter: Payer: Self-pay | Admitting: *Deleted

## 2013-09-11 LAB — URINE CULTURE

## 2013-09-14 LAB — CULTURE, BLOOD (SINGLE)

## 2013-09-16 ENCOUNTER — Encounter: Payer: Self-pay | Admitting: Radiology

## 2013-09-17 ENCOUNTER — Encounter: Payer: Self-pay | Admitting: Family Medicine

## 2013-09-17 ENCOUNTER — Ambulatory Visit (INDEPENDENT_AMBULATORY_CARE_PROVIDER_SITE_OTHER): Payer: Medicare Other | Admitting: Family Medicine

## 2013-09-17 VITALS — BP 118/62 | HR 97 | Temp 97.5°F | Ht 66.0 in | Wt 172.2 lb

## 2013-09-17 DIAGNOSIS — B373 Candidiasis of vulva and vagina: Secondary | ICD-10-CM

## 2013-09-17 DIAGNOSIS — E860 Dehydration: Secondary | ICD-10-CM

## 2013-09-17 DIAGNOSIS — N390 Urinary tract infection, site not specified: Secondary | ICD-10-CM

## 2013-09-17 DIAGNOSIS — R300 Dysuria: Secondary | ICD-10-CM

## 2013-09-17 LAB — POCT UA - MICROSCOPIC ONLY
Casts, Ur, LPF, POC: 0
Yeast, UA: 0

## 2013-09-17 LAB — POCT URINALYSIS DIPSTICK
Nitrite, UA: NEGATIVE
Spec Grav, UA: 1.025
pH, UA: 6

## 2013-09-17 MED ORDER — TERCONAZOLE 0.4 % VA CREA: TOPICAL_CREAM | VAGINAL | Status: DC

## 2013-09-17 MED ORDER — HYDROCODONE-ACETAMINOPHEN 5-325 MG PO TABS: ORAL_TABLET | ORAL | Status: DC

## 2013-09-17 NOTE — Patient Instructions (Signed)
Drink more water  Take the keflex as directed - when we get urine culture back - we may change it depending on result  We will do urology referral at check out  Use terazol cream on affected areas once daily for yeast  If worse - please let me know

## 2013-09-17 NOTE — Progress Notes (Signed)
Subjective:    Patient ID: Kristina Mahoney, female    DOB: [birth year], 72 y.o.   MRN: [id number]  HPI Pt here for hosp f/u [facility] from 10/21-10/22 for hypotension and uti  Prior to this she was tx with cipro for proteus uti, (twice) and then urine grew out yeast and was tx with fluconazole   In hosp urine grew out oxacillin resistant S epidermidis tx with zosyn and IV fluids  tx with keflex at discharge  Felt better for just a few days   Today ua is pos for leukocytes   Now having symptoms of vaginal itch and discharge  Also uti symptoms - now are back - burning to urinate and bladder pressure  Urgency-then goes a few drops at a time/ with a little incontinence (has worn depends) No blood in urine but is a bit dark Not enough fluids -not drinking as much as she should Used to drink a lot of diet coke  She usually gets uti 3-4 times per year   No n/v   Patient Active Problem List   Diagnosis Date Noted  . Dehydration 09/17/2013  . Yeast vaginitis 09/17/2013  . Hypotension 09/08/2013  . Mobitz type 1 second degree AV block 06/23/2013  . Type II or unspecified type diabetes mellitus with peripheral circulatory disorders, uncontrolled(250.72) 06/02/2013  . CAD (coronary artery disease) of artery bypass graft 06/02/2013  . OA (osteoarthritis) 06/02/2013  . PVD (peripheral vascular disease) 06/02/2013  . Pressure ulcer, heel 06/02/2013  . Ulcer of left heel 06/02/2013  . Sacral decubitus ulcer 06/02/2013  . Diabetic peripheral neuropathy 06/02/2013  . Hyperlipidemia   . Depression   . Recurrent UTI    Past Medical History  Diagnosis Date  . Diabetes mellitus without complication   . PVD (peripheral vascular disease)     Followed by Dr. Wyn Quaker.  ABI on left leg is 0.72 (05/2013)  . Arthritis   . CAD (coronary artery disease)   . Colon polyps   . History of Clostridium difficile colitis 2013  . Hypertension   . Hyperlipidemia   . Depression   . Recurrent UTI   . Cataract   .  MI (myocardial infarction)   . Skin cancer   . UTI (urinary tract infection)    Past Surgical History  Procedure Laterality Date  . Cholecystectomy    . Cataract extraction    . Cardiac catheterization  2001    [place];   . Coronary artery bypass graft      [facility] [place]  . Peripheral arterial stent graft     History  Substance Use Topics  . Smoking status: Never Smoker   . Smokeless tobacco: Not on file  . Alcohol Use: No   Family History  Problem Relation Age of Onset  . Heart disease Mother   . Heart disease Father    Allergies  Allergen Reactions  . Tegretol [Carbamazepine] Nausea And Vomiting  . Trileptal [Oxcarbazepine] Nausea And Vomiting   Current Outpatient Prescriptions on File Prior to Visit  Medication Sig Dispense Refill  . aspirin 81 MG tablet Take 81 mg by mouth daily.      Marland Kitchen glucose blood (BAYER CONTOUR TEST) test strip Check blood sugar 4 x daily as needed.  Dx 250.00  150 each  5  . insulin aspart (NOVOLOG) 100 UNIT/ML injection Use sliding scale if glucose over 150      . insulin glargine (LANTUS) 100 UNIT/ML injection Inject  15 units daily      . isosorbide mononitrate (IMDUR) 60 MG 24 hr tablet 1 tablet by mouth twice daily.  60 tablet  3  . potassium chloride (K-DUR,KLOR-CON) 10 MEQ tablet TAKE 2 TABLETS BY MOUTH EVERY DAY  60 tablet  2  . pregabalin (LYRICA) 50 MG capsule Take 1 capsule (50 mg total) by mouth 2 (two) times daily.  60 capsule  3  . rosuvastatin (CRESTOR) 40 MG tablet Take 1 tablet (40 mg total) by mouth daily.  90 tablet  1   No current facility-administered medications on file prior to visit.    Review of Systems Review of Systems  Constitutional: Negative for fever, appetite change,  and unexpected weight change. pos for fatigue Eyes: Negative for pain and visual disturbance.  Respiratory: Negative for cough and shortness of breath.   Cardiovascular: Negative for cp or palpitations     Gastrointestinal: Negative for nausea, diarrhea and constipation.  Genitourinary: pos  for urgency and frequency. neg for hematuria  Skin: Negative for pallor or rash   Neurological: Negative for weakness, light-headedness, numbness and headaches.  Hematological: Negative for adenopathy. Does not bruise/bleed easily.  Psychiatric/Behavioral: Negative for dysphoric mood. The patient is  nervous/anxious.         Objective:   Physical Exam  Constitutional: She appears well-developed and well-nourished. No distress.  HENT:  Head: Normocephalic and atraumatic.  Mouth/Throat: Oropharynx is clear and moist.  Eyes: Conjunctivae and EOM are normal. Pupils are equal, round, and reactive to light. No scleral icterus.  Neck: Normal range of motion. Neck supple. Carotid bruit is not present.  Cardiovascular: Normal rate and regular rhythm.   Pulmonary/Chest: Effort normal and breath sounds normal. No respiratory distress. She has no wheezes.  Abdominal: Soft. Bowel sounds are normal. She exhibits no distension and no mass. There is tenderness in the suprapubic area.  Mild R cva tenderness  Genitourinary: No breast swelling, tenderness, discharge or bleeding.  Musculoskeletal: Normal range of motion.  Lymphadenopathy:    She has no cervical adenopathy.  Neurological: She is alert. She has normal reflexes. Coordination normal.  Sitting in wheelchair  Skin: Skin is warm and dry. No rash noted. No erythema. No pallor.  Sallow complexion  Psychiatric: She has a normal mood and affect.          Assessment & Plan:

## 2013-09-18 ENCOUNTER — Ambulatory Visit (INDEPENDENT_AMBULATORY_CARE_PROVIDER_SITE_OTHER): Payer: Medicare Other | Admitting: Cardiovascular Disease

## 2013-09-18 ENCOUNTER — Encounter: Payer: Self-pay | Admitting: Cardiovascular Disease

## 2013-09-18 VITALS — BP 102/74 | HR 87 | Ht 66.0 in | Wt 172.0 lb

## 2013-09-18 DIAGNOSIS — I25810 Atherosclerosis of coronary artery bypass graft(s) without angina pectoris: Secondary | ICD-10-CM

## 2013-09-18 DIAGNOSIS — I441 Atrioventricular block, second degree: Secondary | ICD-10-CM

## 2013-09-18 DIAGNOSIS — I959 Hypotension, unspecified: Secondary | ICD-10-CM

## 2013-09-18 DIAGNOSIS — N390 Urinary tract infection, site not specified: Secondary | ICD-10-CM

## 2013-09-18 DIAGNOSIS — R0789 Other chest pain: Secondary | ICD-10-CM

## 2013-09-18 LAB — URINE CULTURE: Organism ID, Bacteria: NO GROWTH

## 2013-09-18 NOTE — Patient Instructions (Signed)
Continue same medications.   Your physician wants you to follow-up in: 6 months.  You will receive a reminder letter in the mail two months in advance. If you don't receive a letter, please call our office to schedule the follow-up appointment.  

## 2013-09-18 NOTE — Assessment & Plan Note (Signed)
This is associated with trifascicular block. Avoid AV nodal blocking medications. Continue to monitor. No indication for a pacemaker at this time.

## 2013-09-18 NOTE — Progress Notes (Signed)
Primary care physician: Dr. Dayton Martes  HPI  This is a 72 year old female who is here today for a followup visit regarding coronary artery disease and bradycardia.  She is accompanied today by her daughter. She moved here from [place] in May in order to live with ay her daughter and be in a warmer weather with the hope of improving her severe OA.  She has multiple chronic medical conditions. She is known to have coronary artery disease and underwent CABG in 2006. She reports having cardiac catheterization done a few years post CABG which showed two out of three grafts to be occluded.  She has been told in the past that have heart condition was not strong enough to tolerate noncardiac surgeries.   Functional capacity is very limited due to severe osteoarthritis. She uses a walker and frequently a wheelchair.  She has multiple pressure ulcers  and nonhealing ulcer on the left foot.  She has prolonged history of  diabetes.  she was hospitalized in July at [facility] for weakness with hypotension. She was taken off Lasix.   Metoprolol was recently stopped due to bradycardia. She underwent a Holter monitor which showed sinus rhythm with occasional PACs and PVCs. Average heart rate was 69 beats per minute with frequent pauses at night but less than 2 seconds in duration.  She has been suffering from recurrent urinary tract infection and was hospitalized briefly at [facility] due to this and hypotension. She improved with IV antibiotics and was discharged home. She had recurrent symptoms of UTI since then and was referred by Dr. Milinda Antis to urology.  Allergies  Allergen Reactions  . Tegretol [Carbamazepine] Nausea And Vomiting  . Trileptal [Oxcarbazepine] Nausea And Vomiting     Current Outpatient Prescriptions on File Prior to Visit  Medication Sig Dispense Refill  . aspirin 81 MG tablet Take 81 mg by mouth daily.      . cephALEXin (KEFLEX) 500 MG capsule Take 500 mg by mouth 3 (three) times daily.      Marland Kitchen glucose blood  (BAYER CONTOUR TEST) test strip Check blood sugar 4 x daily as needed.  Dx 250.00  150 each  5  . HYDROcodone-acetaminophen (NORCO/VICODIN) 5-325 MG per tablet Take one by mouth three times a day  90 tablet  0  . insulin aspart (NOVOLOG) 100 UNIT/ML injection Use sliding scale if glucose over 150      . insulin glargine (LANTUS) 100 UNIT/ML injection Inject 15 units daily      . isosorbide mononitrate (IMDUR) 60 MG 24 hr tablet 1 tablet by mouth twice daily.  60 tablet  3  . potassium chloride (K-DUR,KLOR-CON) 10 MEQ tablet TAKE 2 TABLETS BY MOUTH EVERY DAY  60 tablet  2  . pregabalin (LYRICA) 50 MG capsule Take 1 capsule (50 mg total) by mouth 2 (two) times daily.  60 capsule  3  . rosuvastatin (CRESTOR) 40 MG tablet Take 1 tablet (40 mg total) by mouth daily.  90 tablet  1  . terconazole (TERAZOL 7) 0.4 % vaginal cream Apply to affected area once daily for yeast infection  45 g  0   No current facility-administered medications on file prior to visit.     Past Medical History  Diagnosis Date  . Diabetes mellitus without complication   . PVD (peripheral vascular disease)     Followed by Dr. Wyn Quaker.  ABI on left leg is 0.72 (05/2013)  . Arthritis   . CAD (coronary artery disease)   . Colon  polyps   . History of Clostridium difficile colitis 2013  . Hypertension   . Hyperlipidemia   . Depression   . Recurrent UTI   . Cataract   . MI (myocardial infarction)   . Skin cancer   . UTI (urinary tract infection)      Past Surgical History  Procedure Laterality Date  . Cholecystectomy    . Cataract extraction    . Cardiac catheterization  2001    [place];   . Coronary artery bypass graft      [facility] [place]  . Peripheral arterial stent graft       Family History  Problem Relation Age of Onset  . Heart disease Mother   . Heart disease Father      History   Social History  . Marital Status: Single    Spouse Name: N/A    Number of  Children: N/A  . Years of Education: N/A   Occupational History  . Not on file.   Social History Main Topics  . Smoking status: Never Smoker   . Smokeless tobacco: Not on file  . Alcohol Use: No  . Drug Use: No  . Sexual Activity: Not on file   Other Topics Concern  . Not on file   Social History Narrative   Dependent on her daughter for care, including complicated wound dressings.   Daughter seems very attentive.      Desires CPR, does not want long term life support.     ROS 10 point review of system was performed. It's negative other than what's mentioned in the history of present illness.    PHYSICAL EXAM   BP 102/74  Pulse 87  Ht 5\' 6"  (1.676 m)  Wt 172 lb (78.019 kg)  BMI 27.77 kg/m2 Constitutional: She is oriented to person, place, and time. She appears well-developed and well-nourished. No distress.  HENT: No nasal discharge.  Head: Normocephalic and atraumatic.  Eyes: Pupils are equal and round. Right eye exhibits no discharge. Left eye exhibits no discharge.  Neck: Normal range of motion. Neck supple. No JVD present. No thyromegaly present.  Cardiovascular: Normal rate, regular rhythm, normal heart sounds. Exam reveals no gallop and no friction rub. No murmur heard.  Pulmonary/Chest: Effort normal and breath sounds normal. No stridor. No respiratory distress. She has no wheezes. She has no rales. She exhibits no tenderness.  Abdominal: Soft. Bowel sounds are normal. She exhibits no distension. There is no tenderness. There is no rebound and no guarding.  Musculoskeletal: Normal range of motion. She exhibits mild edema worse on the left side and no tenderness.  Neurological: She is alert and oriented to person, place, and time. Coordination normal.  Skin: Skin is warm and dry. Chronic stasis dermatitis. She is not diaphoretic. No erythema. No pallor.  Psychiatric: She has a normal mood and affect. Her behavior is normal. Judgment and thought content normal.      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   ASSESSMENT AND PLAN

## 2013-09-18 NOTE — Assessment & Plan Note (Signed)
Proteus tx by quinolone , then yeast tx with diflucan , then staph in hosp (? Contaminant) ua still pos Sent for cx On keflex  Ref to urol  Will call if symptoms worsen  Disc imp of hydration Rev hosp records and studies with pt and caregiver in great detail today >25 min spent with face to face with patient, >50% counseling and/or coordinating care

## 2013-09-18 NOTE — Assessment & Plan Note (Signed)
Agree with urology referral.

## 2013-09-18 NOTE — Assessment & Plan Note (Signed)
Continue medical therapy for coronary artery disease.

## 2013-09-18 NOTE — Assessment & Plan Note (Signed)
Vitals improved from hosp Disc imp of po hydration in detail

## 2013-09-18 NOTE — Assessment & Plan Note (Signed)
terazol topically for now - monitor  Has been on many abx

## 2013-09-20 ENCOUNTER — Encounter: Payer: Self-pay | Admitting: Cardiothoracic Surgery

## 2013-09-25 ENCOUNTER — Ambulatory Visit: Payer: Medicare Other | Admitting: Cardiovascular Disease

## 2013-09-25 ENCOUNTER — Other Ambulatory Visit: Payer: Self-pay | Admitting: Family Medicine

## 2013-09-25 ENCOUNTER — Other Ambulatory Visit: Payer: Self-pay

## 2013-10-08 ENCOUNTER — Other Ambulatory Visit: Payer: Self-pay | Admitting: Family Medicine

## 2013-10-08 NOTE — Telephone Encounter (Signed)
Phoned in.

## 2013-10-08 NOTE — Telephone Encounter (Signed)
Ok to phone in.

## 2013-10-10 ENCOUNTER — Encounter: Payer: Self-pay | Admitting: Family Medicine

## 2013-10-20 ENCOUNTER — Other Ambulatory Visit: Payer: Self-pay

## 2013-10-20 ENCOUNTER — Encounter: Payer: Self-pay | Admitting: *Deleted

## 2013-10-20 MED ORDER — HYDROCODONE-ACETAMINOPHEN 5-325 MG PO TABS: ORAL_TABLET | ORAL | Status: DC

## 2013-10-20 NOTE — Telephone Encounter (Signed)
Daughter advised Rx ready for pick-up

## 2013-10-20 NOTE — Telephone Encounter (Signed)
Px printed for pick up in IN box  

## 2013-10-20 NOTE — Telephone Encounter (Signed)
Debbie left v/m requesting rx for hydrocodone apap. Call when ready for pick up. Pt is needing rx by Wed. 10/22/13. Nicki Reaper NP out of office.

## 2013-11-05 ENCOUNTER — Telehealth: Payer: Self-pay | Admitting: *Deleted

## 2013-11-05 NOTE — Telephone Encounter (Signed)
Informed patient that Dr. Kirke Corin does not think her issues could be from stopping metoprolol. Patient advised to make appt with PCP. Patient and family verbalized understanding.

## 2013-11-05 NOTE — Telephone Encounter (Signed)
Patients home health nurse stated that "the patient has been complaining of severe hot and cold spells for the last few months that started around the time of her last UTI and she was taken of metoprolol. Her heart rate and pressure when she is hot averages SBP=140'S HR= 40's and when she is cold the opposite SBP=100's HR= 90's." The nurse and patient wanted to know if this could be related to her UTI or medication change?

## 2013-11-05 NOTE — Telephone Encounter (Signed)
I don' think that is related to stopping Metoprolol. Likely due to UTI or something else. I suggest evaluation by PCP.

## 2013-11-06 ENCOUNTER — Encounter: Payer: Self-pay | Admitting: Family Medicine

## 2013-11-06 ENCOUNTER — Ambulatory Visit (INDEPENDENT_AMBULATORY_CARE_PROVIDER_SITE_OTHER): Payer: Medicare Other | Admitting: Family Medicine

## 2013-11-06 VITALS — BP 102/58 | HR 85 | Temp 97.3°F | Wt 169.0 lb

## 2013-11-06 DIAGNOSIS — R6889 Other general symptoms and signs: Secondary | ICD-10-CM | POA: Insufficient documentation

## 2013-11-06 DIAGNOSIS — R531 Weakness: Secondary | ICD-10-CM

## 2013-11-06 DIAGNOSIS — R5381 Other malaise: Secondary | ICD-10-CM

## 2013-11-06 DIAGNOSIS — Z23 Encounter for immunization: Secondary | ICD-10-CM

## 2013-11-06 NOTE — Assessment & Plan Note (Addendum)
With mult medical problems including longstanding DM2.  See above.  See notes on labs.  No obvious source on exam today.  I wonder if an autonomic process is contributing. Would like to discuss with Dr. Milinda Antis who had seen patient prev.  >25 min spent with face to face with patient, >50% counseling and/or coordinating care.

## 2013-11-06 NOTE — Progress Notes (Signed)
Pre-visit discussion using our clinic review tool. No additional management support is needed unless otherwise documented below in the visit note.  "Hot and cold" for the last 2 months.  Chills and then sweats.  It can be regional- her L side may be hot and the R side may be cold at the same time.  She feels diffusely weak.  Pulse has been 40s-90s at home. Off metoprolol now. Unclear if the sx are related to coming off BB; sx started around the time of recent bladder infections.  H/o mult UTIs, treated.   H/o DM2 since 1991. Had seen Dr. Elvera Lennox with endo for DM2.   Pt will have a period of time from 3AM to 6AM (not an error- she repeats 3AM to 6AM) when she feels better. Sugar has ~150 in the AM.  H/o constipation recently.  No vomiting.    She notes that when her pulse is low, she feels hot.  When her pulse is high, she feels cold.    Less burning with urination now, since starting suppressive tx.  She had a cystoscopy done 2 weeks ago.    Meds, vitals, and allergies reviewed.   ROS: See HPI.  Otherwise, noncontributory.  Nad, elderly female ncat Mmm rrr ctab abd soft, not ttp Ext with 1 BLE edema CN 2-12 wnl B, S/S grossly wnl x4

## 2013-11-06 NOTE — Patient Instructions (Signed)
Go to the lab on the way out.  We'll contact you with your lab report. Let me talk to Dr. Milinda Antis in the meantime.  Take care.

## 2013-11-07 LAB — CBC WITH DIFFERENTIAL/PLATELET
Basophils Absolute: 0 10*3/uL (ref 0.0–0.1)
HCT: 37.6 % (ref 36.0–46.0)
Lymphs Abs: 1.4 10*3/uL (ref 0.7–4.0)
MCHC: 33.1 g/dL (ref 30.0–36.0)
MCV: 90.2 fl (ref 78.0–100.0)
Monocytes Absolute: 0.2 10*3/uL (ref 0.1–1.0)
Monocytes Relative: 2.6 % — ABNORMAL LOW (ref 3.0–12.0)
Neutro Abs: 5 10*3/uL (ref 1.4–7.7)
Neutrophils Relative %: 72.9 % (ref 43.0–77.0)
Platelets: 210 10*3/uL (ref 150.0–400.0)
RDW: 14.8 % — ABNORMAL HIGH (ref 11.5–14.6)

## 2013-11-07 LAB — COMPREHENSIVE METABOLIC PANEL
ALT: 10 U/L (ref 0–35)
AST: 17 U/L (ref 0–37)
Albumin: 3.9 g/dL (ref 3.5–5.2)
Alkaline Phosphatase: 91 U/L (ref 39–117)
CO2: 22 mEq/L (ref 19–32)
Chloride: 108 mEq/L (ref 96–112)
GFR: 83.15 mL/min (ref >60.00)
Glucose, Bld: 329 mg/dL — ABNORMAL HIGH (ref 70–99)
Potassium: 4 mEq/L (ref 3.5–5.1)
Sodium: 139 mEq/L (ref 135–145)
Total Bilirubin: 1.3 mg/dL — ABNORMAL HIGH (ref 0.3–1.2)
Total Protein: 6.4 g/dL (ref 6.0–8.3)

## 2013-11-07 LAB — TSH: TSH: 1.31 u[IU]/mL (ref 0.35–5.50)

## 2013-11-14 ENCOUNTER — Ambulatory Visit (INDEPENDENT_AMBULATORY_CARE_PROVIDER_SITE_OTHER): Payer: Medicare Other | Admitting: Podiatry

## 2013-11-14 ENCOUNTER — Encounter: Payer: Self-pay | Admitting: Podiatry

## 2013-11-14 VITALS — BP 119/61 | HR 72 | Resp 18 | Ht 65.0 in | Wt 169.0 lb

## 2013-11-14 DIAGNOSIS — M79609 Pain in unspecified limb: Secondary | ICD-10-CM

## 2013-11-14 DIAGNOSIS — B351 Tinea unguium: Secondary | ICD-10-CM

## 2013-11-14 NOTE — Progress Notes (Signed)
Subjective:     Patient ID: Kristina Mahoney, female   DOB: [birth year], 72 y.o.   MRN: [id number]  HPI patient presents with caregiver with severe nail disease pain in thickness 1-5 of both feet   Review of Systems  All other systems reviewed and are negative.       Objective:   Physical Exam  Nursing note and vitals reviewed. Constitutional: She is oriented to person, place, and time.  Cardiovascular: Intact distal pulses.   Musculoskeletal: Normal range of motion.  Neurological: She is oriented to person, place, and time.  Skin: Skin is dry.   neurovascular status is intact with muscle strength diminished and mild equinus condition noted of both feet. 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

## 2013-11-14 NOTE — Progress Notes (Signed)
   Subjective:    Patient ID: Kristina Mahoney, female    DOB: [birth year], 72 y.o.   MRN: [id number]  HPI Comments: Diabetic foot care, she needs her toenails cut , she had just got wounds on her feet healed up and her nails are way over due ..   N diabetic foot care , toenails need to be cut , yellow thick and painful  L  Both feet 1-5 toenails  D diabetic for over 20 years  O C  A  T cant cut self       Review of Systems  Constitutional: Positive for chills, activity change, appetite change and fatigue.       Sweating  Weight change   HENT: Positive for hearing loss.        Ringing in ears   Eyes: Positive for pain.  Respiratory: Positive for shortness of breath.   Cardiovascular: Positive for chest pain.  Gastrointestinal: Positive for constipation.  Endocrine: Positive for cold intolerance and heat intolerance.       Diabetic  Genitourinary: Positive for urgency.       Frequency  Musculoskeletal:       Joint pain , difficulty walking   Skin: Positive for color change.  Neurological: Positive for numbness.  Hematological: Bruises/bleeds easily.       Slow to heal        Objective:   Physical Exam        Assessment & Plan:

## 2013-11-18 ENCOUNTER — Other Ambulatory Visit: Payer: Self-pay

## 2013-11-18 MED ORDER — HYDROCODONE-ACETAMINOPHEN 5-325 MG PO TABS: ORAL_TABLET | ORAL | Status: DC

## 2013-11-18 NOTE — Telephone Encounter (Signed)
Spoke to pts daughter and informed her that Rx will be available for pickup on tomorrow at the front desk. It has been approved and printed but is awaiting a signature.

## 2013-11-18 NOTE — Telephone Encounter (Signed)
Kristina Mahoney left v/m requesting rx hydrocodone apap. Would like to pick up 11/18/13 or 11/19/13. Call when ready for pick up.

## 2013-11-24 ENCOUNTER — Ambulatory Visit (INDEPENDENT_AMBULATORY_CARE_PROVIDER_SITE_OTHER): Payer: Medicare Other | Admitting: Family Medicine

## 2013-11-24 ENCOUNTER — Encounter: Payer: Self-pay | Admitting: Family Medicine

## 2013-11-24 VITALS — BP 102/72 | HR 86 | Temp 97.3°F | Wt 173.2 lb

## 2013-11-24 DIAGNOSIS — R0989 Other specified symptoms and signs involving the circulatory and respiratory systems: Secondary | ICD-10-CM

## 2013-11-24 DIAGNOSIS — R0689 Other abnormalities of breathing: Secondary | ICD-10-CM

## 2013-11-24 DIAGNOSIS — E876 Hypokalemia: Secondary | ICD-10-CM | POA: Insufficient documentation

## 2013-11-24 MED ORDER — POTASSIUM CHLORIDE CRYS ER 10 MEQ PO TBCR: 10.0000 meq | EXTENDED_RELEASE_TABLET | Freq: Once | ORAL | Status: DC

## 2013-11-24 NOTE — Assessment & Plan Note (Signed)
Decrease Kdur to 10 meq daily. Recheck lytes in 2 weeks at f/u.

## 2013-11-24 NOTE — Assessment & Plan Note (Addendum)
Lung clear on exam, vital signs reassuring. Reassurance provided.  Will recheck in 2 weeks.  If concern persists, will order CXR and or order BNP.  The patient indicates understanding of these issues and agrees with the plan.

## 2013-11-24 NOTE — Progress Notes (Signed)
Pre-visit discussion using our clinic review tool. No additional management support is needed unless otherwise documented below in the visit note.  

## 2013-11-24 NOTE — Progress Notes (Signed)
Subjective:    Patient ID: Kristina Mahoney, female    DOB: May 16, 1941, 73 y.o.   MRN: [id number]  HPI 73 yo with complicated medical history here with her daughter for ?abnormal lung sounds.  Home health nurse thought she heard "a crackle" last week that wasn't here prior.  Previously on very high doses of lasix for LE edema- 120 mg qam and 80 mg qhs.  This was stopped in June when she became hypotensive (she is followed by cardiology, Dr. Fletcher Anon).  Of note, remains on potassium 10 meq twice daily.  She denies any cough, SOB, chest pain, orthopnea.  She is wheelchair bound mostly she not sure if she has had any DOE.  Patient Active Problem List   Diagnosis Date Noted  . Respiratory sounds, abnormal 11/24/2013  . Hypokalemia 11/24/2013  . Sensation of feeling hot 11/06/2013  . Yeast vaginitis 09/17/2013  . Hypotension 09/08/2013  . Mobitz type 1 second degree AV block 06/23/2013  . Type II or unspecified type diabetes mellitus with peripheral circulatory disorders, uncontrolled(250.72) 06/02/2013  . CAD (coronary artery disease) of artery bypass graft 06/02/2013  . OA (osteoarthritis) 06/02/2013  . PVD (peripheral vascular disease) 06/02/2013  . Pressure ulcer, heel 06/02/2013  . Ulcer of left heel 06/02/2013  . Sacral decubitus ulcer 06/02/2013  . Diabetic peripheral neuropathy 06/02/2013  . Hyperlipidemia   . Depression   . Recurrent UTI    Past Medical History  Diagnosis Date  . Diabetes mellitus without complication   . PVD (peripheral vascular disease)     Followed by Dr. Lucky Cowboy.  ABI on left leg is 0.72 (05/2013)  . Arthritis   . CAD (coronary artery disease)   . Colon polyps   . History of Clostridium difficile colitis 2013  . Hypertension   . Hyperlipidemia   . Depression   . Recurrent UTI   . Cataract   . MI (myocardial infarction)   . Skin cancer   . UTI (urinary tract infection)    Past Surgical History  Procedure Laterality Date  . Cholecystectomy    .  Cataract extraction    . Cardiac catheterization  2001    [place]; [facility];   . Coronary artery bypass graft      [place]  . Peripheral arterial stent graft     History  Substance Use Topics  . Smoking status: Never Smoker   . Smokeless tobacco: Not on file  . Alcohol Use: No   Family History  Problem Relation Age of Onset  . Heart disease Mother   . Heart disease Father    Allergies  Allergen Reactions  . Tegretol [Carbamazepine] Nausea And Vomiting  . Trileptal [Oxcarbazepine] Nausea And Vomiting   Current Outpatient Prescriptions on File Prior to Visit  Medication Sig Dispense Refill  . aspirin 81 MG tablet Take 81 mg by mouth daily.      Marland Kitchen glucose blood (BAYER CONTOUR TEST) test strip Check blood sugar 4 x daily as needed.  Dx 250.00  150 each  5  . HYDROcodone-acetaminophen (NORCO/VICODIN) 5-325 MG per tablet Take one by mouth three times a day  90 tablet  0  . insulin aspart (NOVOLOG) 100 UNIT/ML injection Use sliding scale if glucose over 150      . insulin glargine (LANTUS) 100 UNIT/ML injection Inject 15 units daily      . isosorbide mononitrate (IMDUR) 60 MG 24 hr tablet TAKE 1 TABLET BY MOUTH TWICE DAILY.  60 tablet  3  . LYRICA 50 MG capsule TAKE 1 CAPSULE TWICE A DAY  60 capsule  3  . nitrofurantoin (MACRODANTIN) 50 MG capsule Take 50 mg by mouth daily.      . rosuvastatin (CRESTOR) 40 MG tablet Take 1 tablet (40 mg total) by mouth daily.  90 tablet  1   No current facility-administered medications on file prior to visit.    Review of Systems See HPI No CP or SOB No cough No fevers No hemoptysis       Objective:   Physical Exam  BP 102/72  Pulse 86  Temp(Src) 97.3 F (36.3 C) (Oral)  Wt 173 lb 4 oz (78.586 kg)  SpO2 97%  Constitutional: She appears well-developed and well-nourished. No distress.  HENT:  Head: Normocephalic and atraumatic.  Mouth/Throat: Oropharynx is clear and moist.  Eyes: Conjunctivae  and EOM are normal. Pupils are equal, round, and reactive to light. No scleral icterus.  Neck: Normal range of motion. Neck supple. Carotid bruit is not present.  Cardiovascular: Normal rate and regular rhythm.   Pulmonary/Chest: Effort normal and breath sounds normal. No respiratory distress. She has no wheezes.  Abdominal: Soft. Bowel sounds are normal. She exhibits no distension and no mass.  Genitourinary: No breast swelling, tenderness, discharge or bleeding.  Musculoskeletal: Normal range of motion.  Lymphadenopathy:    She has no cervical adenopathy.  Neurological: She is alert. She has normal reflexes. Coordination normal.  Sitting in wheelchair  Skin: Skin is warm and dry. No rash noted. No erythema. No pallor.  Chronic stasis dermatitis, thick legs but trace edema Psychiatric: She has a normal mood and affect.       Assessment & Plan:

## 2013-11-24 NOTE — Patient Instructions (Signed)
Good to see you. Let's cut back potassium to 10 meq daily. Come see me in 1-2 weeks.

## 2013-12-05 DIAGNOSIS — E119 Type 2 diabetes mellitus without complications: Secondary | ICD-10-CM

## 2013-12-05 DIAGNOSIS — L89309 Pressure ulcer of unspecified buttock, unspecified stage: Secondary | ICD-10-CM

## 2013-12-05 DIAGNOSIS — M159 Polyosteoarthritis, unspecified: Secondary | ICD-10-CM

## 2013-12-05 DIAGNOSIS — L8992 Pressure ulcer of unspecified site, stage 2: Secondary | ICD-10-CM

## 2013-12-08 ENCOUNTER — Encounter: Payer: Self-pay | Admitting: Family Medicine

## 2013-12-08 ENCOUNTER — Ambulatory Visit (INDEPENDENT_AMBULATORY_CARE_PROVIDER_SITE_OTHER): Payer: Medicare Other | Admitting: Family Medicine

## 2013-12-08 VITALS — BP 122/52 | HR 65 | Temp 97.2°F | Wt 172.2 lb

## 2013-12-08 DIAGNOSIS — R0989 Other specified symptoms and signs involving the circulatory and respiratory systems: Secondary | ICD-10-CM

## 2013-12-08 DIAGNOSIS — E876 Hypokalemia: Secondary | ICD-10-CM

## 2013-12-08 DIAGNOSIS — R0689 Other abnormalities of breathing: Secondary | ICD-10-CM

## 2013-12-08 LAB — BASIC METABOLIC PANEL
BUN: 17 mg/dL (ref 6–23)
CALCIUM: 8.4 mg/dL (ref 8.4–10.5)
CHLORIDE: 106 meq/L (ref 96–112)
CO2: 26 mEq/L (ref 19–32)
Creatinine, Ser: 0.8 mg/dL (ref 0.4–1.2)
GFR: 72.69 mL/min (ref >60.00)
Glucose, Bld: 248 mg/dL — ABNORMAL HIGH (ref 70–99)
Potassium: 3.5 mEq/L (ref 3.5–5.1)
Sodium: 140 mEq/L (ref 135–145)

## 2013-12-08 NOTE — Assessment & Plan Note (Signed)
Recheck potassium today with lower dose of Kdur. Orders Placed This Encounter  Procedures  . Basic metabolic panel

## 2013-12-08 NOTE — Assessment & Plan Note (Signed)
Lungs clear again today. Reassurance provided. Call or return to clinic prn if these symptoms worsen or fail to improve as anticipated.

## 2013-12-08 NOTE — Progress Notes (Signed)
Subjective:    Patient ID: Kristina Mahoney, female    DOB: 03-10-1941, 73 y.o.   MRN: [id number]  HPI 73 yo with complicated medical history here with her daughter for two week follow up.  Saw her two weeks ago for ?abnormal lung sounds.  Home health nurse thought she heard "a crackle" which I could not hear on exam.  She has had no SOB since.  I advised her to come back in 2 weeks for recheck.  Was also on previously on very high doses of lasix for LE edema- 120 mg qam and 80 mg qhs.  This was stopped in June when she became hypotensive (she is followed by cardiology, Dr. Fletcher Anon).  Of note, remained on potassium 10 meq twice daily so we decreased this to 10 meq daily 2 weeks ago.   Patient Active Problem List   Diagnosis Date Noted  . Respiratory sounds, abnormal 11/24/2013  . Hypokalemia 11/24/2013  . Sensation of feeling hot 11/06/2013  . Yeast vaginitis 09/17/2013  . Hypotension 09/08/2013  . Mobitz type 1 second degree AV block 06/23/2013  . Type II or unspecified type diabetes mellitus with peripheral circulatory disorders, uncontrolled(250.72) 06/02/2013  . CAD (coronary artery disease) of artery bypass graft 06/02/2013  . OA (osteoarthritis) 06/02/2013  . PVD (peripheral vascular disease) 06/02/2013  . Pressure ulcer, heel 06/02/2013  . Ulcer of left heel 06/02/2013  . Sacral decubitus ulcer 06/02/2013  . Diabetic peripheral neuropathy 06/02/2013  . Hyperlipidemia   . Depression   . Recurrent UTI    Past Medical History  Diagnosis Date  . Diabetes mellitus without complication   . PVD (peripheral vascular disease)     Followed by Dr. Lucky Cowboy.  ABI on left leg is 0.72 (05/2013)  . Arthritis   . CAD (coronary artery disease)   . Colon polyps   . History of Clostridium difficile colitis 2013  . Hypertension   . Hyperlipidemia   . Depression   . Recurrent UTI   . Cataract   . MI (myocardial infarction)   . Skin cancer   . UTI (urinary tract infection)    Past Surgical  History  Procedure Laterality Date  . Cholecystectomy    . Cataract extraction    . Cardiac catheterization  2001    [place]; [facility];   . Coronary artery bypass graft      [place]  . Peripheral arterial stent graft     History  Substance Use Topics  . Smoking status: Never Smoker   . Smokeless tobacco: Not on file  . Alcohol Use: No   Family History  Problem Relation Age of Onset  . Heart disease Mother   . Heart disease Father    Allergies  Allergen Reactions  . Tegretol [Carbamazepine] Nausea And Vomiting  . Trileptal [Oxcarbazepine] Nausea And Vomiting   Current Outpatient Prescriptions on File Prior to Visit  Medication Sig Dispense Refill  . aspirin 81 MG tablet Take 81 mg by mouth daily.      Marland Kitchen glucose blood (BAYER CONTOUR TEST) test strip Check blood sugar 4 x daily as needed.  Dx 250.00  150 each  5  . HYDROcodone-acetaminophen (NORCO/VICODIN) 5-325 MG per tablet Take one by mouth three times a day  90 tablet  0  . insulin aspart (NOVOLOG) 100 UNIT/ML injection Use sliding scale if glucose over 150      . insulin glargine (LANTUS) 100 UNIT/ML injection Inject 15 units daily      .  isosorbide mononitrate (IMDUR) 60 MG 24 hr tablet TAKE 1 TABLET BY MOUTH TWICE DAILY.  60 tablet  3  . LYRICA 50 MG capsule TAKE 1 CAPSULE TWICE A DAY  60 capsule  3  . nitrofurantoin (MACRODANTIN) 50 MG capsule Take 50 mg by mouth daily.      . potassium chloride (K-DUR,KLOR-CON) 10 MEQ tablet Take 1 tablet (10 mEq total) by mouth once.  30 tablet  2  . rosuvastatin (CRESTOR) 40 MG tablet Take 1 tablet (40 mg total) by mouth daily.  90 tablet  1   No current facility-administered medications on file prior to visit.    Review of Systems See HPI No CP or SOB No cough No fevers No hemoptysis       Objective:   Physical Exam  BP 122/52  Pulse 65  Temp(Src) 97.2 F (36.2 C) (Oral)  Wt 172 lb 4 oz (78.132 kg)  SpO2 97%  Constitutional:  She appears well-developed and well-nourished. No distress.  HENT:  Head: Normocephalic and atraumatic.  Mouth/Throat: Oropharynx is clear and moist.  Eyes: Conjunctivae and EOM are normal. Pupils are equal, round, and reactive to light. No scleral icterus.  Neck: Normal range of motion. Neck supple. Carotid bruit is not present.  Cardiovascular: Normal rate and regular rhythm.   Pulmonary/Chest: Effort normal and breath sounds normal. No respiratory distress. She has no wheezes.  Abdominal: Soft. Bowel sounds are normal. She exhibits no distension and no mass.  Genitourinary: No breast swelling, tenderness, discharge or bleeding.  Musculoskeletal: Normal range of motion.  Lymphadenopathy:    She has no cervical adenopathy.  Neurological: She is alert. She has normal reflexes. Coordination normal.  Sitting in wheelchair  Skin: Skin is warm and dry. No rash noted. No erythema. No pallor.  Chronic stasis dermatitis, thick legs but trace edema Psychiatric: She has a normal mood and affect.       Assessment & Plan:

## 2013-12-08 NOTE — Progress Notes (Signed)
Pre-visit discussion using our clinic review tool. No additional management support is needed unless otherwise documented below in the visit note.  

## 2013-12-12 ENCOUNTER — Other Ambulatory Visit: Payer: Self-pay | Admitting: Family Medicine

## 2013-12-22 ENCOUNTER — Other Ambulatory Visit: Payer: Self-pay | Admitting: *Deleted

## 2013-12-22 MED ORDER — HYDROCODONE-ACETAMINOPHEN 5-325 MG PO TABS: 5.0000 | ORAL_TABLET | Freq: Three times a day (TID) | ORAL | Status: DC | PRN

## 2013-12-22 NOTE — Telephone Encounter (Signed)
Lm on pts vm informing her that Rx is available for pickup at the front desk;pt informed a gov't issued photo id is required for pickup

## 2013-12-29 ENCOUNTER — Encounter: Payer: Self-pay | Admitting: Family Medicine

## 2014-01-14 ENCOUNTER — Ambulatory Visit (INDEPENDENT_AMBULATORY_CARE_PROVIDER_SITE_OTHER): Payer: Medicare Other | Admitting: Family Medicine

## 2014-01-14 ENCOUNTER — Encounter: Payer: Self-pay | Admitting: Family Medicine

## 2014-01-14 VITALS — BP 124/66 | HR 85 | Temp 97.3°F | Wt 173.0 lb

## 2014-01-14 DIAGNOSIS — J069 Acute upper respiratory infection, unspecified: Secondary | ICD-10-CM | POA: Insufficient documentation

## 2014-01-14 DIAGNOSIS — R5381 Other malaise: Secondary | ICD-10-CM

## 2014-01-14 MED ORDER — HYDROCODONE-ACETAMINOPHEN 5-325 MG PO TABS: 5.0000 | ORAL_TABLET | Freq: Three times a day (TID) | ORAL | Status: DC | PRN

## 2014-01-14 NOTE — Progress Notes (Signed)
Pre visit review using our clinic review tool, if applicable. No additional management support is needed unless otherwise documented below in the visit note. 

## 2014-01-14 NOTE — Assessment & Plan Note (Signed)
PT ordered.

## 2014-01-14 NOTE — Progress Notes (Signed)
Subjective:    Patient ID: Kristina Mahoney, female    DOB: 21-Jul-1941, 73 y.o.   MRN: [id number]  HPI 73 yo with complicated medical history here with her daughter for congestion and update on how she is doing.  Home she is living in tested positive for mold and several other things that she feels are making her sick.  Since moving out of the house 3 days ago, congestion has already improved.  Eyes were burning and had a HA- this has also resolved. She would like a note from me stating she needs to break her lease.  Has been walking with a walker and having a harder time getting up and down and around the house on her own.  Her daughter does not feel comfortable leaving her alone in the house.  Patient Active Problem List   Diagnosis Date Noted  . Physical deconditioning 01/14/2014  . Acute upper respiratory infections of unspecified site 01/14/2014  . Respiratory sounds, abnormal 11/24/2013  . Hypokalemia 11/24/2013  . Sensation of feeling hot 11/06/2013  . Yeast vaginitis 09/17/2013  . Hypotension 09/08/2013  . Mobitz type 1 second degree AV block 06/23/2013  . Type II or unspecified type diabetes mellitus with peripheral circulatory disorders, uncontrolled(250.72) 06/02/2013  . CAD (coronary artery disease) of artery bypass graft 06/02/2013  . OA (osteoarthritis) 06/02/2013  . PVD (peripheral vascular disease) 06/02/2013  . Pressure ulcer, heel 06/02/2013  . Ulcer of left heel 06/02/2013  . Sacral decubitus ulcer 06/02/2013  . Diabetic peripheral neuropathy 06/02/2013  . Hyperlipidemia   . Depression   . Recurrent UTI    Past Medical History  Diagnosis Date  . Diabetes mellitus without complication   . PVD (peripheral vascular disease)     Followed by Dr. Lucky Cowboy.  ABI on left leg is 0.72 (05/2013)  . Arthritis   . CAD (coronary artery disease)   . Colon polyps   . History of Clostridium difficile colitis 2013  . Hypertension   . Hyperlipidemia   . Depression   . Recurrent  UTI   . Cataract   . MI (myocardial infarction)   . Skin cancer   . UTI (urinary tract infection)    Past Surgical History  Procedure Laterality Date  . Cholecystectomy    . Cataract extraction    . Cardiac catheterization  2001    [place]; [facility];   . Coronary artery bypass graft      [place]  . Peripheral arterial stent graft     History  Substance Use Topics  . Smoking status: Never Smoker   . Smokeless tobacco: Not on file  . Alcohol Use: No   Family History  Problem Relation Age of Onset  . Heart disease Mother   . Heart disease Father    Allergies  Allergen Reactions  . Tegretol [Carbamazepine] Nausea And Vomiting  . Trileptal [Oxcarbazepine] Nausea And Vomiting   Current Outpatient Prescriptions on File Prior to Visit  Medication Sig Dispense Refill  . aspirin 81 MG tablet Take 81 mg by mouth daily.      . CRESTOR 40 MG tablet TAKE 1 TABLET BY MOUTH EVERY DAY  90 tablet  1  . glucose blood (BAYER CONTOUR TEST) test strip Check blood sugar 4 x daily as needed.  Dx 250.00  150 each  5  . insulin aspart (NOVOLOG) 100 UNIT/ML injection Use sliding scale if glucose over 150      . insulin glargine (  LANTUS) 100 UNIT/ML injection Inject 15 units daily      . isosorbide mononitrate (IMDUR) 60 MG 24 hr tablet TAKE 1 TABLET BY MOUTH TWICE DAILY.  60 tablet  3  . LYRICA 50 MG capsule TAKE 1 CAPSULE TWICE A DAY  60 capsule  3  . nitrofurantoin (MACRODANTIN) 50 MG capsule Take 50 mg by mouth daily.      . potassium chloride (K-DUR,KLOR-CON) 10 MEQ tablet Take 1 tablet (10 mEq total) by mouth once.  30 tablet  2   No current facility-administered medications on file prior to visit.    Review of Systems See HPI No CP or SOB No cough No fevers No hemoptysis       Objective:   Physical Exam  BP 124/66  Pulse 85  Temp(Src) 97.3 F (36.3 C) (Oral)  Wt 173 lb (78.472 kg)  SpO2 97%  Constitutional: She appears well-developed  and well-nourished. No distress.  HENT:  Head: Normocephalic and atraumatic.  Mouth/Throat: Oropharynx is clear and moist.  Eyes: Conjunctivae and EOM are normal. Pupils are equal, round, and reactive to light. No scleral icterus.  Neck: Normal range of motion. Neck supple. Carotid bruit is not present.  Cardiovascular: Normal rate and regular rhythm.   Pulmonary/Chest: Effort normal and breath sounds normal. No respiratory distress. She has no wheezes.  Abdominal: Soft. Bowel sounds are normal. She exhibits no distension and no mass.  Genitourinary: No breast swelling, tenderness, discharge or bleeding.  Musculoskeletal: Normal range of motion.  Lymphadenopathy:    She has no cervical adenopathy.  Neurological: She is alert. She has normal reflexes. Coordination normal.  Sitting in wheelchair  Skin: Skin is warm and dry. No rash noted. No erythema. No pallor.  Chronic stasis dermatitis, thick legs but trace edema Psychiatric: She has a normal mood and affect.       Assessment & Plan:

## 2014-01-14 NOTE — Assessment & Plan Note (Signed)
Seems secondary to allergy to mold/other compounds.  Exam benign- reassurance provided.  Letter written and given to pt's daughter stating due to her medical conditions, she does need to move out of the house.  Call or return to clinic prn if these symptoms worsen or fail to improve as anticipated.

## 2014-02-03 ENCOUNTER — Telehealth: Payer: Self-pay | Admitting: Family Medicine

## 2014-02-03 NOTE — Telephone Encounter (Signed)
I am concerned about fever  In setting of abdominal pain.  Does she have another reason for fever like viral URI ?  If not she needs to be seen at urgent care/ER  Today for eval of pain.  Otherwise if fever clearly  from separate issue, she  could use enema.

## 2014-02-03 NOTE — Telephone Encounter (Signed)
Dr. Deborra Medina is out of the office this afternoon.  Please advise.

## 2014-02-03 NOTE — Telephone Encounter (Signed)
Patient Information:  Caller Name: Debbie/Daughter  Phone: [phone]  Patient: Kristina Mahoney, Kristina Mahoney  Gender: Female  DOB: 1941-04-07  Age: 73 Years  PCP: Arnette Norris [facility])  Office Follow Up:  Does the office need to follow up with this patient?: Yes  Instructions For The Office: Please contact daughter.  No appt time available.  She would like to try enema.  Please review and advise.  RN Note:  Please contact daughter.  No appt time available.  She would like to try enema.  Please review and advise.  Symptoms  Reason For Call & Symptoms: Daughter states her mother has not had a BM in two days.  She is now having a BM that is very hard, coming out in pieces and painful.  Gave her Dulcolax suppository and pill at 05:00 .   She feels like she has to go but nothing is coming out.  Temperature 100.4 (o).  Complains of rectal discomfort/pain and abdominal pressure.  She currently has a pressure sore.  Patient has been sick and having Physicial Therapy.  Reviewed Health History In EMR: Yes  Reviewed Medications In EMR: Yes  Reviewed Allergies In EMR: Yes  Reviewed Surgeries / Procedures: Yes  Date of Onset of Symptoms: 02/03/2014  Any Fever: Yes  Fever Taken: Oral  Fever Time Of Reading: 10:30:00  Fever Last Reading: 100.4  Guideline(s) Used:  Constipation  Disposition Per Guideline:   See Today in Office  Reason For Disposition Reached:   Severe rectal pain not relieved by Sitz bath or glycerine suppository  Advice Given:  Liquids:  Drink 6-8 glasses of water a day (Caution: certain medical conditions require fluid restriction).  Prune juice is a natural laxative.  Avoid alcohol.  General Constipation Instructions:  Eat a high fiber diet.  Drink adequate liquids.  Sitz Bath for Rectal Pain due to Constipation:   Take a 20 minute Sitz bath. Sitting in the warm water may help relax the anal sphincter and release the bowel movement.  You can make a Sitz bath by adding 2  ounces (60 grams) of baking soda to a bathtub containing warm water.  RN Overrode Recommendation:  Make Appointment  Please contact daughter.  No appt time available.  She would like to try enema.  Please review and advise.

## 2014-02-03 NOTE — Telephone Encounter (Signed)
Left message for daughter as instructed by Dr. Diona Browner.

## 2014-02-04 ENCOUNTER — Emergency Department (HOSPITAL_COMMUNITY)
Admission: EM | Admit: 2014-02-04 | Discharge: 2014-02-04 | Disposition: A | Discharge status: Home / Self Care | Payer: Medicare Other | Attending: Emergency Medicine | Admitting: Emergency Medicine

## 2014-02-04 ENCOUNTER — Encounter (HOSPITAL_COMMUNITY): Payer: Self-pay | Admitting: Emergency Medicine

## 2014-02-04 ENCOUNTER — Emergency Department (HOSPITAL_COMMUNITY): Payer: Medicare Other

## 2014-02-04 DIAGNOSIS — F3289 Other specified depressive episodes: Secondary | ICD-10-CM | POA: Insufficient documentation

## 2014-02-04 DIAGNOSIS — Z860100 Personal history of colon polyps, unspecified: Secondary | ICD-10-CM | POA: Insufficient documentation

## 2014-02-04 DIAGNOSIS — M129 Arthropathy, unspecified: Secondary | ICD-10-CM | POA: Insufficient documentation

## 2014-02-04 DIAGNOSIS — E119 Type 2 diabetes mellitus without complications: Secondary | ICD-10-CM | POA: Insufficient documentation

## 2014-02-04 DIAGNOSIS — Z8619 Personal history of other infectious and parasitic diseases: Secondary | ICD-10-CM | POA: Insufficient documentation

## 2014-02-04 DIAGNOSIS — Z951 Presence of aortocoronary bypass graft: Secondary | ICD-10-CM | POA: Insufficient documentation

## 2014-02-04 DIAGNOSIS — Z7982 Long term (current) use of aspirin: Secondary | ICD-10-CM | POA: Insufficient documentation

## 2014-02-04 DIAGNOSIS — K5641 Fecal impaction: Secondary | ICD-10-CM | POA: Insufficient documentation

## 2014-02-04 DIAGNOSIS — Z87440 Personal history of urinary (tract) infections: Secondary | ICD-10-CM | POA: Insufficient documentation

## 2014-02-04 DIAGNOSIS — Z79899 Other long term (current) drug therapy: Secondary | ICD-10-CM | POA: Insufficient documentation

## 2014-02-04 DIAGNOSIS — N390 Urinary tract infection, site not specified: Secondary | ICD-10-CM | POA: Insufficient documentation

## 2014-02-04 DIAGNOSIS — L89109 Pressure ulcer of unspecified part of back, unspecified stage: Secondary | ICD-10-CM | POA: Insufficient documentation

## 2014-02-04 DIAGNOSIS — L8990 Pressure ulcer of unspecified site, unspecified stage: Secondary | ICD-10-CM

## 2014-02-04 DIAGNOSIS — F329 Major depressive disorder, single episode, unspecified: Secondary | ICD-10-CM | POA: Insufficient documentation

## 2014-02-04 DIAGNOSIS — L8992 Pressure ulcer of unspecified site, stage 2: Secondary | ICD-10-CM | POA: Insufficient documentation

## 2014-02-04 DIAGNOSIS — Z85828 Personal history of other malignant neoplasm of skin: Secondary | ICD-10-CM | POA: Insufficient documentation

## 2014-02-04 DIAGNOSIS — Z8669 Personal history of other diseases of the nervous system and sense organs: Secondary | ICD-10-CM | POA: Insufficient documentation

## 2014-02-04 DIAGNOSIS — Z794 Long term (current) use of insulin: Secondary | ICD-10-CM | POA: Insufficient documentation

## 2014-02-04 DIAGNOSIS — I252 Old myocardial infarction: Secondary | ICD-10-CM | POA: Insufficient documentation

## 2014-02-04 DIAGNOSIS — I2510 Atherosclerotic heart disease of native coronary artery without angina pectoris: Secondary | ICD-10-CM | POA: Insufficient documentation

## 2014-02-04 DIAGNOSIS — I10 Essential (primary) hypertension: Secondary | ICD-10-CM | POA: Insufficient documentation

## 2014-02-04 DIAGNOSIS — R1909 Other intra-abdominal and pelvic swelling, mass and lump: Secondary | ICD-10-CM | POA: Insufficient documentation

## 2014-02-04 DIAGNOSIS — E785 Hyperlipidemia, unspecified: Secondary | ICD-10-CM | POA: Insufficient documentation

## 2014-02-04 LAB — URINALYSIS, ROUTINE W REFLEX MICROSCOPIC
BILIRUBIN URINE: NEGATIVE
Glucose, UA: NEGATIVE mg/dL
KETONES UR: 15 mg/dL — AB
NITRITE: POSITIVE — AB
Protein, ur: 30 mg/dL — AB
Specific Gravity, Urine: 1.016 (ref 1.005–1.030)
Urobilinogen, UA: 1 mg/dL (ref 0.0–1.0)
pH: 6 (ref 5.0–8.0)

## 2014-02-04 LAB — CBC WITH DIFFERENTIAL/PLATELET
Basophils Absolute: 0 10*3/uL (ref 0.0–0.1)
Basophils Relative: 0 % (ref 0–1)
EOS PCT: 0 % (ref 0–5)
Eosinophils Absolute: 0 10*3/uL (ref 0.0–0.7)
HEMATOCRIT: 37 % (ref 36.0–46.0)
Hemoglobin: 12.9 g/dL (ref 12.0–15.0)
LYMPHS ABS: 0.7 10*3/uL (ref 0.7–4.0)
LYMPHS PCT: 4 % — AB (ref 12–46)
MCH: 31.2 pg (ref 26.0–34.0)
MCHC: 34.9 g/dL (ref 30.0–36.0)
MCV: 89.6 fL (ref 78.0–100.0)
MONO ABS: 1.2 10*3/uL — AB (ref 0.1–1.0)
MONOS PCT: 8 % (ref 3–12)
Neutro Abs: 14.5 10*3/uL — ABNORMAL HIGH (ref 1.7–7.7)
Neutrophils Relative %: 88 % — ABNORMAL HIGH (ref 43–77)
PLATELETS: 161 10*3/uL (ref 150–400)
RBC: 4.13 MIL/uL (ref 3.87–5.11)
RDW: 13.3 % (ref 11.5–15.5)
WBC: 16.4 10*3/uL — ABNORMAL HIGH (ref 4.0–10.5)

## 2014-02-04 LAB — COMPREHENSIVE METABOLIC PANEL
ALT: 9 U/L (ref 0–35)
AST: 15 U/L (ref 0–37)
Albumin: 3.2 g/dL — ABNORMAL LOW (ref 3.5–5.2)
Alkaline Phosphatase: 90 U/L (ref 39–117)
BUN: 20 mg/dL (ref 6–23)
CALCIUM: 9.2 mg/dL (ref 8.4–10.5)
CO2: 16 meq/L — AB (ref 19–32)
Chloride: 97 mEq/L (ref 96–112)
Creatinine, Ser: 0.89 mg/dL (ref 0.50–1.10)
GFR calc Af Amer: 73 mL/min — ABNORMAL LOW (ref >90)
GFR calc non Af Amer: 63 mL/min — ABNORMAL LOW (ref >90)
Glucose, Bld: 264 mg/dL — ABNORMAL HIGH (ref 70–99)
Potassium: 3.6 mEq/L — ABNORMAL LOW (ref 3.7–5.3)
SODIUM: 136 meq/L — AB (ref 137–147)
Total Bilirubin: 1.8 mg/dL — ABNORMAL HIGH (ref 0.3–1.2)
Total Protein: 6.4 g/dL (ref 6.0–8.3)

## 2014-02-04 LAB — URINE MICROSCOPIC-ADD ON

## 2014-02-04 LAB — CLOSTRIDIUM DIFFICILE BY PCR: Toxigenic C. Difficile by PCR: POSITIVE — AB

## 2014-02-04 LAB — LIPASE, BLOOD: Lipase: 7 U/L — ABNORMAL LOW (ref 11–59)

## 2014-02-04 LAB — I-STAT CG4 LACTIC ACID, ED: Lactic Acid, Venous: 3.06 mmol/L — ABNORMAL HIGH (ref 0.5–2.2)

## 2014-02-04 LAB — POC OCCULT BLOOD, ED: Fecal Occult Bld: NEGATIVE

## 2014-02-04 MED ORDER — DEXTROSE 5 % IV SOLN
1.0000 g | Freq: Once | INTRAVENOUS | Status: AC
  Administered 2014-02-04: 1 g via INTRAVENOUS
  Filled 2014-02-04: qty 10

## 2014-02-04 MED ORDER — IOHEXOL 300 MG/ML  SOLN
80.0000 mL | Freq: Once | INTRAMUSCULAR | Status: AC | PRN
  Administered 2014-02-04: 80 mL via INTRAVENOUS

## 2014-02-04 MED ORDER — CEPHALEXIN 500 MG PO CAPS: 500.0000 mg | ORAL_CAPSULE | Freq: Three times a day (TID) | ORAL | Status: DC

## 2014-02-04 MED ORDER — IOHEXOL 300 MG/ML  SOLN
25.0000 mL | Freq: Once | INTRAMUSCULAR | Status: AC | PRN
Start: 2014-02-04 — End: 2014-02-04
  Administered 2014-02-04: 25 mL via ORAL

## 2014-02-04 MED ORDER — FLEET ENEMA 7-19 GM/118ML RE ENEM: 1.0000 | ENEMA | Freq: Once | RECTAL | Status: DC

## 2014-02-04 NOTE — ED Notes (Signed)
MD at bedside. 

## 2014-02-04 NOTE — Telephone Encounter (Signed)
Attempted to contact pt, unable to leave vm

## 2014-02-04 NOTE — ED Notes (Signed)
Karle Starch, MD at bedside discussing plan of care

## 2014-02-04 NOTE — ED Notes (Signed)
Patient transported to CT 

## 2014-02-04 NOTE — ED Notes (Signed)
Pt c/o diarrhea onset yesterday, per family pt was constipated on Monday & has received Miralax & 2 Dulcolax pills on Monday, denies n/v, denies SOB, denies CP, A&O x4, follows commands, speaks in complete sentences

## 2014-02-04 NOTE — ED Notes (Signed)
Karle Starch, MD at bedside completing digital rectal manipulation of stool ball, pt tolerated procedure well, pt placed on bedside commode

## 2014-02-04 NOTE — Discharge Instructions (Signed)
Fecal Impaction A fecal impaction happens when there is a large, firm amount of stool (or feces) that cannot be passed. The impacted stool is usually in the rectum, which is the lowest part of the large bowel. The impacted stool can block the colon and cause significant problems. CAUSES  The longer stool stays in the rectum, the harder it gets. Anything that slows down your bowel movements can lead to fecal impaction, such as:  Constipation. This can be a long-standing (chronic) problem or can happen suddenly (acute).  Painful conditions of the rectum, such as hemorrhoids or anal fissures. The pain of these conditions can make you try to avoid having bowel movements.  Narcotic pain-relieving medicines, such as methadone, morphine, or codeine.  Not drinking enough fluids.  Inactivity and bed rest over long periods of time.  Diseases of the brain or nervous system that damage the nerves controlling the muscles of the intestines. SIGNS AND SYMPTOMS   Lack of normal bowel movements or changes in bowel patterns.  Sense of fullness in the rectum but unable to pass stool.  Pain or cramps in the abdominal area (often after meals).  Thin, watery discharge from the rectum. DIAGNOSIS  Your health care provider may suspect that you have a fecal impaction based on your symptoms and a physical exam. This will include an exam of your rectum. Sometimes X-rays or lab testing may be needed to confirm the diagnosis and to be sure there are no other problems.  TREATMENT   Initially an impaction can be removed manually. Using a gloved finger, your health care provider can remove hard stool from your rectum.  Medicine is sometimes needed. A suppository or enema can be given in the rectum to soften the stool, which can stimulate a bowel movement. Medicines can also be given by mouth (orally).  Though rare, surgery may be needed if the colon has torn (perforated) due to blockage. HOME CARE INSTRUCTIONS    Develop regular bowel habits. This could include getting in the habit of having a bowel movement after your morning cup of coffee or after eating. Be sure to allow yourself enough time on the toilet.  Maintain a high-fiber diet.  Drink enough fluids to keep your urine clear or pale yellow as directed by your health care provider.  Exercise regularly.  If you begin to get constipated, increase the amount of fiber in your diet. Eat plenty of fruits, vegetables, whole wheat breads, bran, oatmeal, and similar products.  Take natural fiber laxatives or other laxatives only as directed by your health care provider. SEEK MEDICAL CARE IF:   You have ongoing rectal pain.  You require enemas or suppositories more than twice a week.  You have rectal bleeding.  You have continued problems, or you develop abdominal pain.  You have thin, pencil-like stools. SEEK IMMEDIATE MEDICAL CARE IF:  You have black or tarry stools. MAKE SURE YOU:   Understand these instructions.  Will watch your condition.  Will get help right away if you are not doing well or get worse. Document Released: 07/29/2004 Document Revised: 08/27/2013 Document Reviewed: 05/13/2013 [facility] Patient Information 2014 [place].  Constipation, Adult Constipation is when a person has fewer than 3 bowel movements a week; has difficulty having a bowel movement; or has stools that are dry, hard, or larger than normal. As people grow older, constipation is more common. If you try to fix constipation with medicines that make you have a bowel movement (laxatives), the problem may  get worse. Long-term laxative use may cause the muscles of the colon to become weak. A low-fiber diet, not taking in enough fluids, and taking certain medicines may make constipation worse. CAUSES   Certain medicines, such as antidepressants, pain medicine, iron supplements, antacids, and water pills.   Certain diseases, such as diabetes, irritable  bowel syndrome (IBS), thyroid disease, or depression.   Not drinking enough water.   Not eating enough fiber-rich foods.   Stress or travel.  Lack of physical activity or exercise.  Not going to the restroom when there is the urge to have a bowel movement.  Ignoring the urge to have a bowel movement.  Using laxatives too much. SYMPTOMS   Having fewer than 3 bowel movements a week.   Straining to have a bowel movement.   Having hard, dry, or larger than normal stools.   Feeling full or bloated.   Pain in the lower abdomen.  Not feeling relief after having a bowel movement. DIAGNOSIS  Your caregiver will take a medical history and perform a physical exam. Further testing may be done for severe constipation. Some tests may include:   A barium enema X-ray to examine your rectum, colon, and sometimes, your small intestine.  A sigmoidoscopy to examine your lower colon.  A colonoscopy to examine your entire colon. TREATMENT  Treatment will depend on the severity of your constipation and what is causing it. Some dietary treatments include drinking more fluids and eating more fiber-rich foods. Lifestyle treatments may include regular exercise. If these diet and lifestyle recommendations do not help, your caregiver may recommend taking over-the-counter laxative medicines to help you have bowel movements. Prescription medicines may be prescribed if over-the-counter medicines do not work.  HOME CARE INSTRUCTIONS   Increase dietary fiber in your diet, such as fruits, vegetables, whole grains, and beans. Limit high-fat and processed sugars in your diet, such as [place] fries, hamburgers, cookies, candies, and soda.   A fiber supplement may be added to your diet if you cannot get enough fiber from foods.   Drink enough fluids to keep your urine clear or pale yellow.   Exercise regularly or as directed by your caregiver.   Go to the restroom when you have the urge to go. Do  not hold it.  Only take medicines as directed by your caregiver. Do not take other medicines for constipation without talking to your caregiver first. [place] IF:   You have bright red blood in your stool.   Your constipation lasts for more than 4 days or gets worse.   You have abdominal or rectal pain.   You have thin, pencil-like stools.  You have unexplained weight loss. MAKE SURE YOU:   Understand these instructions.  Will watch your condition.  Will get help right away if you are not doing well or get worse. Document Released: 08/04/2004 Document Revised: 01/29/2012 Document Reviewed: 08/18/2013 [facility] Patient Information 2014 [place], [place].  Urinary Tract Infection Urinary tract infections (UTIs) can develop anywhere along your urinary tract. Your urinary tract is your body's drainage system for removing wastes and extra water. Your urinary tract includes two kidneys, two ureters, a bladder, and a urethra. Your kidneys are a pair of bean-shaped organs. Each kidney is about the size of your fist. They are located below your ribs, one on each side of your spine. CAUSES Infections are caused by microbes, which are microscopic organisms, including fungi, viruses, and bacteria. These organisms are so small that they  can only be seen through a microscope. Bacteria are the microbes that most commonly cause UTIs. SYMPTOMS  Symptoms of UTIs may vary by age and gender of the patient and by the location of the infection. Symptoms in young women typically include a frequent and intense urge to urinate and a painful, burning feeling in the bladder or urethra during urination. Older women and men are more likely to be tired, shaky, and weak and have muscle aches and abdominal pain. A fever may mean the infection is in your kidneys. Other symptoms of a kidney infection include pain in your back or sides below the ribs, nausea, and vomiting. DIAGNOSIS To diagnose a  UTI, your caregiver will ask you about your symptoms. Your caregiver also will ask to provide a urine sample. The urine sample will be tested for bacteria and white blood cells. White blood cells are made by your body to help fight infection. TREATMENT  Typically, UTIs can be treated with medication. Because most UTIs are caused by a bacterial infection, they usually can be treated with the use of antibiotics. The choice of antibiotic and length of treatment depend on your symptoms and the type of bacteria causing your infection. HOME CARE INSTRUCTIONS  If you were prescribed antibiotics, take them exactly as your caregiver instructs you. Finish the medication even if you feel better after you have only taken some of the medication.  Drink enough water and fluids to keep your urine clear or pale yellow.  Avoid caffeine, tea, and carbonated beverages. They tend to irritate your bladder.  Empty your bladder often. Avoid holding urine for long periods of time.  Empty your bladder before and after sexual intercourse.  After a bowel movement, women should cleanse from front to back. Use each tissue only once. SEEK MEDICAL CARE IF:   You have back pain.  You develop a fever.  Your symptoms do not begin to resolve within 3 days. SEEK IMMEDIATE MEDICAL CARE IF:   You have severe back pain or lower abdominal pain.  You develop chills.  You have nausea or vomiting.  You have continued burning or discomfort with urination. MAKE SURE YOU:   Understand these instructions.  Will watch your condition.  Will get help right away if you are not doing well or get worse. Document Released: 08/16/2005 Document Revised: 05/07/2012 Document Reviewed: 12/15/2011 [facility] Patient Information 2014 [place]. Pressure Ulcer A pressure ulcer is a sore that has formed from the breakdown of skin and exposure of deeper layers of tissue. It develops in areas of the body where there is unrelieved  pressure. Pressure ulcers are usually found over a bony area, such as the shoulder blades, spine, lower back, hips, knees, ankles, and heels. Pressure ulcers vary in severity. Your health care provider may determine the severity (stage) of your pressure ulcer. The stages include:  Stage I The skin is red, and when the skin is pressed, it stays red.  Stage II The top layer of skin is gone, and there is a shallow, pink ulcer.  Stage III The ulcer becomes deeper, and it is more difficult to see the whole wound. Also, there may be yellow or brown parts, as well as pink and red parts.  Stage IV The ulcer may be deep and red, pink, brown, white, or yellow. Bone or muscle may be seen.  Unstageable pressure ulcer The ulcer is covered almost completely with black, brown, or yellow tissue. It is not known how deep the ulcer  is or what stage it is until this covering comes off.  Suspected deep tissue injury A person's skin can be injured from pressure or pulling on the skin when his or her position is changed. The skin appears purple or maroon. There may not be an opening in the skin, but there could be a blood-filled blister. This deep tissue injury is often difficult to see in people with darker skin tones. The site may open and become deeper in time. However, early interventions will help the area heal and may prevent the area from opening. CAUSES  Pressure ulcers are caused by pressure against the skin that limits the flow of blood to the skin and nearby tissues. There are many risk factors that can lead to pressure sores. RISK FACTORS  Decreased ability to move.  Decreased ability to feel pain or discomfort.  Excessive skin moisture from urine, stool, sweat, or secretions.  Poor nutrition.  Dehydration.  Tobacco, drug, or alcohol abuse.  Having someone pull on bedsheets that are under you, such as when health care workers are changing your position in a hospital bed.  Obesity.  Increased  adult age.  Age of less than 2 years.  Premature newborns.  Hospitalization in a critical care unit for longer than 4 days with use of medical devices.  Prolonged use of medical devices.  Critical illness.  Anemia.  Traumatic brain injury.  Spinal cord injury.  Stroke.  Diabetes.  Poor blood glucose control.  Low blood pressure (hypotension).  Low oxygen levels.  Medicines that reduce blood flow.  Infection. DIAGNOSIS  Your health care provider will diagnose your pressure ulcer based on its appearance. The health care provider may determine the stage of your pressure ulcer as well. Tests may be done to check for infection, to assess your circulation, or to check for other diseases, such as diabetes. TREATMENT  Treatment of your pressure ulcer begins with determining what stage the ulcer is in. Your treatment team may include your health care provider, a wound care specialist, a nutritionist, a physical therapist, and a Psychologist, sport and exercise. Possible treatments may include:   Moving or repositioning every 1 2 hours.  Using beds or mattresses to shift your body weight and pressure points frequently.  Improving your diet.  Cleaning and bandaging (dressing) the open wound.  Giving antibiotic medicines.  Removing damaged tissue.  Surgery and sometimes skin grafts. HOME CARE INSTRUCTIONS  If you were hospitalized, follow the care plan that was started in the hospital.  Avoid staying in the same position for more than 2 hours. Use padding, devices, or mattresses to cushion your pressure points as directed by your health care provider.  Eat a well-balanced diet. Take nutritional supplements and vitamins as directed by your health care provider.  Keep all follow-up appointments.  Only take over-the-counter or prescription medicines for pain, fever, or discomfort as directed by your health care provider. SEEK MEDICAL CARE IF:   Your pressure ulcer is not improving.  You do  not know how to care for your pressure ulcer.  You notice other areas of redness on your skin. SEEK IMMEDIATE MEDICAL CARE IF:   You have increasing redness, swelling, or pain in your pressure ulcer.  You notice pus coming from your pressure ulcer.  You have a fever.  You notice a bad smell coming from the wound or dressing.  Your pressure ulcer opens up again. Document Released: 11/06/2005 Document Revised: 08/27/2013 Document Reviewed: 07/14/2013 [facility] Patient Information 2014 [place].

## 2014-02-04 NOTE — ED Provider Notes (Addendum)
CSN: [id number]     Arrival date & time 02/04/14  [id number] History   First MD Initiated Contact with Patient 02/04/14 0830     Chief Complaint  Patient presents with  . Diarrhea     (Consider location/radiation/quality/duration/timing/severity/associated sxs/prior Treatment) Patient is a 73 y.o. female presenting with diarrhea.  Diarrhea  Pt with multiple medical problems including c-diff colitis in 2013 brought in by daughter for evaluation of abdominal pain and diarrhea. She was having pain and hard stools earlier this week, given Miralax and colace and then began to have copious loose but non blood stools, worsening abdominal cramping and frequent urination. She was on daily prophylactic nitrofurantoin for frequent UTI but no other recent Abx. She denies vomiting but has had some nausea. Daughter reports fever of 100.73F yesterday but none overnight or this AM.   Past Medical History  Diagnosis Date  . Diabetes mellitus without complication   . PVD (peripheral vascular disease)     Followed by Dr. Lucky Cowboy.  ABI on left leg is 0.72 (05/2013)  . Arthritis   . CAD (coronary artery disease)   . Colon polyps   . History of Clostridium difficile colitis 2013  . Hypertension   . Hyperlipidemia   . Depression   . Recurrent UTI   . Cataract   . MI (myocardial infarction)   . Skin cancer   . UTI (urinary tract infection)    Past Surgical History  Procedure Laterality Date  . Cholecystectomy    . Cataract extraction    . Cardiac catheterization  2001    [place]; [facility];   . Coronary artery bypass graft      [place]  . Peripheral arterial stent graft     Family History  Problem Relation Age of Onset  . Heart disease Mother   . Heart disease Father    History  Substance Use Topics  . Smoking status: Never Smoker   . Smokeless tobacco: Not on file  . Alcohol Use: No   OB History   Grav Para Term Preterm Abortions TAB SAB Ect Mult Living                  Review of Systems  Gastrointestinal: Positive for diarrhea.    All other systems reviewed and are negative except as noted in HPI.    Allergies  Tegretol and Trileptal  Home Medications   Current Outpatient Rx  Name  Route  Sig  Dispense  Refill  . aspirin 81 MG tablet   Oral   Take 81 mg by mouth daily.         . CRESTOR 40 MG tablet      TAKE 1 TABLET BY MOUTH EVERY DAY   90 tablet   1   . glucose blood (BAYER CONTOUR TEST) test strip      Check blood sugar 4 x daily as needed.  Dx 250.00   150 each   5   . HYDROcodone-acetaminophen (NORCO/VICODIN) 5-325 MG per tablet   Oral   Take 5-325 tablets by mouth 3 (three) times daily as needed.   90 tablet   0   . insulin aspart (NOVOLOG) 100 UNIT/ML injection      Use sliding scale if glucose over 150         . insulin glargine (LANTUS) 100 UNIT/ML injection      Inject 15 units daily         . isosorbide  mononitrate (IMDUR) 60 MG 24 hr tablet      TAKE 1 TABLET BY MOUTH TWICE DAILY.   60 tablet   3   . LYRICA 50 MG capsule      TAKE 1 CAPSULE TWICE A DAY   60 capsule   3   . nitrofurantoin (MACRODANTIN) 50 MG capsule   Oral   Take 50 mg by mouth daily.         . potassium chloride (K-DUR,KLOR-CON) 10 MEQ tablet   Oral   Take 1 tablet (10 mEq total) by mouth once.   30 tablet   2    BP 157/135  Pulse 101  Temp(Src) 97.9 F (36.6 C) (Oral)  Resp 18  Ht 5\' 5"  (1.651 m)  Wt 155 lb (70.308 kg)  BMI 25.79 kg/m2  SpO2 98% Physical Exam  Nursing note and vitals reviewed. Constitutional: She is oriented to person, place, and time. She appears well-developed and well-nourished.  HENT:  Head: Normocephalic and atraumatic.  Eyes: EOM are normal. Pupils are equal, round, and reactive to light.  Neck: Normal range of motion. Neck supple.  Cardiovascular: Normal rate, normal heart sounds and intact distal pulses.   Pulmonary/Chest: Effort normal and breath sounds normal. She has  no wheezes. She has no rales.  Abdominal: Bowel sounds are normal. She exhibits mass (large hard midline lower abdominal mass). She exhibits no distension. There is no tenderness. There is no rebound and no guarding.  Genitourinary:  Small stage 2 bedsores on sacrum  Musculoskeletal: Normal range of motion. She exhibits no edema and no tenderness.  Neurological: She is alert and oriented to person, place, and time. She has normal strength. No cranial nerve deficit or sensory deficit.  Skin: Skin is warm and dry. No rash noted.  Psychiatric: She has a normal mood and affect.    ED Course  Procedures (including critical care time) Labs Review Labs Reviewed  CBC WITH DIFFERENTIAL - Abnormal; Notable for the following:    WBC 16.4 (*)    Neutrophils Relative % 88 (*)    Neutro Abs 14.5 (*)    Lymphocytes Relative 4 (*)    Monocytes Absolute 1.2 (*)    All other components within normal limits  COMPREHENSIVE METABOLIC PANEL - Abnormal; Notable for the following:    Sodium 136 (*)    Potassium 3.6 (*)    CO2 16 (*)    Glucose, Bld 264 (*)    Albumin 3.2 (*)    Total Bilirubin 1.8 (*)    GFR calc non Af Amer 63 (*)    GFR calc Af Amer 73 (*)    All other components within normal limits  URINALYSIS, ROUTINE W REFLEX MICROSCOPIC - Abnormal; Notable for the following:    APPearance CLOUDY (*)    Hgb urine dipstick LARGE (*)    Ketones, ur 15 (*)    Protein, ur 30 (*)    Nitrite POSITIVE (*)    Leukocytes, UA MODERATE (*)    All other components within normal limits  LIPASE, BLOOD - Abnormal; Notable for the following:    Lipase 7 (*)    All other components within normal limits  URINE MICROSCOPIC-ADD ON - Abnormal; Notable for the following:    Bacteria, UA MANY (*)    All other components within normal limits  I-STAT CG4 LACTIC ACID, ED - Abnormal; Notable for the following:    Lactic Acid, Venous 3.06 (*)    All other components within  normal limits  CLOSTRIDIUM DIFFICILE BY  PCR  POC OCCULT BLOOD, ED   Imaging Review Ct Abdomen Pelvis W Contrast  02/04/2014   CLINICAL DATA:  Diarrhea following treatment for constipation  EXAM: CT ABDOMEN AND PELVIS WITH CONTRAST  TECHNIQUE: Multidetector CT imaging of the abdomen and pelvis was performed using the standard protocol following bolus administration of intravenous contrast.  CONTRAST:  40mL OMNIPAQUE IOHEXOL 300 MG/ML SOLN intravenously ; the patient also received oral contrast material.  FINDINGS: The stomach is partially distended with the oral contrast. Contrast has traversed the normal appearing duodenum. The jejunum and ileum are only partially contrast filled. There is no evidence of enteritis. Contrast has not yet reached the colon. The terminal ileum is normal in appearance. There is a structure consistent with an uninflamed appendix. The stool and gas pattern within the colon does not suggest obstruction. However, there is a moderate stool burden in the left colon and there is a large stool burden in the rectum which may reflect a fecal impaction clinically.  There is a small amount of intrahepatic ductal dilation likely related to previous cholecystectomy and sphincterotomy. There is no focal hepatic mass. The pancreas, spleen, adrenal glands, and kidneys exhibit no acute abnormalities. There is an extrarenal pelvis on the right which is a normal variant. The caliber of the abdominal aorta is normal. There is no periaortic or pericaval lymphadenopathy. There is a small paraumbilical hernia on the left containing fat. This is demonstrated on images 40-51. There is no inguinal hernia. The urinary bladder is normal in appearance. The uterus is small and situated to the right of midline anterior to the mildly distended rectum. There is no adnexal mass nor free pelvic fluid.  The lumbar vertebral bodies are preserved in height. There is degenerative disc change and calcification of the anterior longitudinal ligament at multiple  levels. The bony pelvis exhibits no acute abnormalities. The lung bases exhibit mild bronchiectasis especially on the left. There is no alveolar infiltrate. Minimal subsegmental atelectasis posteriorly is present bilaterally.  IMPRESSION: 1. A moderate amount of stool remains in the descending colon and rectosigmoid with a large amount in the rectal vault. This may reflect a fecal impaction. More proximally the small and large bowel appear normal. There is no evidence of inflammatory change of the bowel. 2. There is no acute hepatobiliary, urinary tract, or gynecological rib abnormality demonstrated. There is no intra-abdominal or pelvic lymphadenopathy, free fluid, or suspicious mass. 3. Bronchiectasis in the left lower lobe is suspected. There is no evidence of pneumonia.   Electronically Signed   By: David  [place]   On: 02/04/2014 11:59     EKG Interpretation   Date/Time:  Wednesday February 04 2014 09:07:03 EDT Ventricular Rate:  88 PR Interval:  210 QRS Duration: 127 QT Interval:  417 QTC Calculation: 505 R Axis:   -48 Text Interpretation:  Sinus or ectopic atrial rhythm Sinus pause RBBB and  LAFB Left ventricular hypertrophy No significant change since first EKG  earlier same day, no other old to compare Confirmed by [facility]  MD,  Juanda Crumble [phone]) on 02/04/2014 9:27:38 AM      MDM   Final diagnoses:  UTI (urinary tract infection)  Decubitus ulcer  Fecal impaction   CT images reviewed, large stool ball on digital rectal exam is relatively soft, broken up digitally, some removed manually, will give enema and attempt to get her disimpacted.   1:10 PM Pt with large BM on bedside commode after disimpaction. Feeling  much better and wants to go home. Will treat for UTI. Bowel regimen to prevent constipation. PCP and Urology followup.   Ralph Benavidez B. Karle Starch, MD 02/04/14 1312  2:19 PM Call from lab for pos C-diff PCR. As above, patient not really having diarrhea but leaking of stool around  stool ball and no evidence of colitis on CT. Spoke with Dr. Linus Salmons on call for ID who feels this is colonization and does not recommend treatment.   Jaimie Pippins B. Karle Starch, MD 02/04/14 [phone]

## 2014-02-09 ENCOUNTER — Ambulatory Visit: Payer: Medicare Other | Admitting: Family Medicine

## 2014-02-13 ENCOUNTER — Ambulatory Visit: Payer: Medicare Other | Admitting: Podiatry

## 2014-02-13 ENCOUNTER — Ambulatory Visit (INDEPENDENT_AMBULATORY_CARE_PROVIDER_SITE_OTHER): Payer: Medicare Other | Admitting: Family Medicine

## 2014-02-13 ENCOUNTER — Encounter: Payer: Self-pay | Admitting: Family Medicine

## 2014-02-13 VITALS — BP 140/74 | HR 90 | Temp 97.4°F | Wt 163.8 lb

## 2014-02-13 DIAGNOSIS — L89159 Pressure ulcer of sacral region, unspecified stage: Secondary | ICD-10-CM

## 2014-02-13 DIAGNOSIS — L8990 Pressure ulcer of unspecified site, unspecified stage: Secondary | ICD-10-CM

## 2014-02-13 DIAGNOSIS — K5641 Fecal impaction: Secondary | ICD-10-CM | POA: Insufficient documentation

## 2014-02-13 DIAGNOSIS — L89109 Pressure ulcer of unspecified part of back, unspecified stage: Secondary | ICD-10-CM

## 2014-02-13 DIAGNOSIS — A0472 Enterocolitis due to Clostridium difficile, not specified as recurrent: Secondary | ICD-10-CM

## 2014-02-13 MED ORDER — VANCOMYCIN HCL 250 MG PO CAPS: 250.0000 mg | ORAL_CAPSULE | Freq: Four times a day (QID) | ORAL | Status: DC

## 2014-02-13 NOTE — Assessment & Plan Note (Signed)
Improving now that she is more ambulatory and has a new bed. Will continue to ask nurse to assess this.

## 2014-02-13 NOTE — Assessment & Plan Note (Signed)
Discussed C diff treatment and prevention. She reports having this year ago and having to placed on oral vanc instead of flagyl.  Will repeat a course of oral vanc and have her follow up with me in 3 months. The patient indicates understanding of these issues and agrees with the plan.

## 2014-02-13 NOTE — Patient Instructions (Signed)
Clostridium Difficile Infection Clostridium difficile (C. difficile) is a bacteria found in the intestinal tract or colon. Under certain conditions, it causes diarrhea and sometimes severe disease. The severe form of the disease is known as pseudomembranous colitis (often called C. difficile colitis). This disease can damage the lining of the colon or cause the colon to become enlarged (toxic megacolon).  CAUSES  Your colon normally contains many different bacteria, including C. difficile. The balance of bacteria in your colon can change during illness. This is especially true when you take antibiotic medicine. Taking antibiotics may allow the C. difficile to grow, multiply excessively, and make a toxin that then causes illness. The elderly and people with certain medical conditions have a greater risk of getting C. difficile infections. SYMPTOMS   Watery diarrhea.  Fever.  Fatigue.  Loss of appetite.  Nausea.  Abdominal swelling, pain, or tenderness.  Dehydration. DIAGNOSIS  Your symptoms may make your caregiver suspicious of a C. difficile infection, especially if you have used antibiotics in the preceding weeks. However, there are only 2 ways to know for certain whether you have a C. difficile infection:  A lab test that finds the toxin in your stool.  The specific appearance of an abnormality (pseudomembrane) in your colon. This can only be seen by doing a sigmoidoscopy or colonoscopy. These procedures involve passing an instrument through your rectum to look at the inside of your colon. Your caregiver will help determine if these tests are necessary. TREATMENT   Most people are successfully treated with one of two specific antibiotics, usually given by mouth. Other antibiotics you are receiving are stopped if possible.  Intravenous (IV) fluids and correction of electrolyte imbalance may be necessary.  Rarely, surgery may be needed to remove the infected part of the  intestines.  Careful hand washing by you and your caregivers is important to prevent the spread of infection. In the hospital, your caregivers may also put on gowns and gloves to prevent the spread of the C. difficile bacteria. Your room is also cleaned regularly with a solution containing bleach or a product that is known to kill C. difficile. HOME CARE INSTRUCTIONS  Drink enough fluids to keep your urine clear or pale yellow. Avoid milk, caffeine, and alcohol.  Ask your caregiver for specific rehydration instructions.  Try eating small, frequent meals rather than large meals.  Take your antibiotics as directed. Finish them even if you start to feel better.  Do not use medicines to slow diarrhea. This could delay healing or cause complications.  Wash your hands thoroughly after using the bathroom and before preparing food.  Make sure people who live with you wash their hands often, too.  Carefully disinfect all surfaces with a product that contains chlorine bleach. SEEK MEDICAL CARE IF:  Diarrhea persists longer than expected or recurs after completing your course of antibiotic treatment for the C. difficile infection.  You have trouble staying hydrated. SEEK IMMEDIATE MEDICAL CARE IF:  You develop a new fever.  You have increasing abdominal pain or tenderness.  There is blood in your stools, or your stools are dark black and tarry.  You cannot hold down food or liquids. MAKE SURE YOU:   Understand these instructions.  Will watch your condition.  Will get help right away if you are not doing well or get worse. Document Released: 08/16/2005 Document Revised: 03/03/2013 Document Reviewed: 04/14/2011 [employer] Patient Information 2014 [employer].  

## 2014-02-13 NOTE — Progress Notes (Signed)
Pre visit review using our clinic review tool, if applicable. No additional management support is needed unless otherwise documented below in the visit note. 

## 2014-02-13 NOTE — Progress Notes (Signed)
Subjective:    Patient ID: Kristina Mahoney, female    DOB: [birth year], 73 y.o.   MRN: [id number]  HPI 73 yo with complicated medical history here with her daughter for ER follow up.  Notes reviewed.  Went to [facility] on 02/04/2014 with constipation, nausea and low grade fever.  Found to have a UTI- started on Keflex but urine cx positive for fungal infection.  Saw urologist on Monday who started her on 7 day course of oral fluconazole.  She is then to resume macrobid.  She has follow up with urology in 3 weeks.  Also had a fecal impaction and has had a lot of relief since her disimpaction of ER.  Of note, in reviewing ER labs, she is positive for C. Diff colitis (PCR).   Stage 2 sacral decub improving with A and D ointment.   Patient Active Problem List   Diagnosis Date Noted  . Fecal impaction 02/13/2014  . Enteritis due to Clostridium difficile 02/13/2014  . Physical deconditioning 01/14/2014  . Acute upper respiratory infections of unspecified site 01/14/2014  . Respiratory sounds, abnormal 11/24/2013  . Hypokalemia 11/24/2013  . Sensation of feeling hot 11/06/2013  . Yeast vaginitis 09/17/2013  . Hypotension 09/08/2013  . Mobitz type 1 second degree AV block 06/23/2013  . Type II or unspecified type diabetes mellitus with peripheral circulatory disorders, uncontrolled(250.72) 06/02/2013  . CAD (coronary artery disease) of artery bypass graft 06/02/2013  . OA (osteoarthritis) 06/02/2013  . PVD (peripheral vascular disease) 06/02/2013  . Pressure ulcer, heel 06/02/2013  . Ulcer of left heel 06/02/2013  . Sacral decubitus ulcer, stage II 06/02/2013  . Diabetic peripheral neuropathy 06/02/2013  . Hyperlipidemia   . Depression   . Recurrent UTI    Past Medical History  Diagnosis Date  . Diabetes mellitus without complication   . PVD (peripheral vascular disease)     Followed by Dr. Lucky Cowboy.  ABI on left leg is 0.72 (05/2013)  . Arthritis   . CAD (coronary artery disease)   . Colon  polyps   . History of Clostridium difficile colitis 2013  . Hypertension   . Hyperlipidemia   . Depression   . Recurrent UTI   . Cataract   . MI (myocardial infarction)   . Skin cancer   . UTI (urinary tract infection)    Past Surgical History  Procedure Laterality Date  . Cholecystectomy    . Cataract extraction    . Cardiac catheterization  2001    [place]; [facility];   . Coronary artery bypass graft      [place]  . Peripheral arterial stent graft     History  Substance Use Topics  . Smoking status: Never Smoker   . Smokeless tobacco: Not on file  . Alcohol Use: No   Family History  Problem Relation Age of Onset  . Heart disease Mother   . Heart disease Father    Allergies  Allergen Reactions  . Morphine And Related Other (See Comments)    REACTION: hallucinations  . Tegretol [Carbamazepine] Nausea And Vomiting  . Trileptal [Oxcarbazepine] Nausea And Vomiting   Current Outpatient Prescriptions on File Prior to Visit  Medication Sig Dispense Refill  . aspirin EC 81 MG tablet Take 81 mg by mouth daily.      Marland Kitchen glucose blood (BAYER CONTOUR TEST) test strip Check blood sugar 4 x daily as needed.  Dx 250.00  150 each  5  . HYDROcodone-acetaminophen (NORCO/VICODIN)  5-325 MG per tablet Take 1 tablet by mouth 3 (three) times daily as needed for moderate pain.      Marland Kitchen insulin aspart (NOVOLOG) 100 UNIT/ML injection Inject into the skin 3 (three) times daily as needed (Takes per sliding scale, only if blood sugar exceeds 150).      . insulin glargine (LANTUS) 100 UNIT/ML injection Inject 15 Units into the skin daily.      . isosorbide mononitrate (IMDUR) 60 MG 24 hr tablet Take 60 mg by mouth 2 (two) times daily.      . nitrofurantoin (MACRODANTIN) 50 MG capsule Take 50 mg by mouth daily.      . Polyvinyl Alcohol-Povidone (REFRESH OP) Place 1 drop into both eyes daily as needed (for dry eyes).      . potassium chloride (K-DUR,KLOR-CON) 10  MEQ tablet Take 10 mEq by mouth 2 (two) times daily.      . pregabalin (LYRICA) 50 MG capsule Take 50 mg by mouth 2 (two) times daily.      . rosuvastatin (CRESTOR) 40 MG tablet Take 40 mg by mouth daily.       No current facility-administered medications on file prior to visit.    Review of Systems See HPI +nausea No fevers No blood in stool +body aches       Objective:   Physical Exam  BP 140/74  Pulse 90  Temp(Src) 97.4 F (36.3 C) (Oral)  Wt 163 lb 12 oz (74.277 kg)  SpO2 95%  Constitutional: She appears well-developed and well-nourished. No distress.  HENT:  Head: Normocephalic and atraumatic.  Mouth/Throat: Oropharynx is clear and moist.  Eyes: Conjunctivae and EOM are normal. Pupils are equal, round, and reactive to light. No scleral icterus.  Neck: Normal range of motion. Neck supple. Carotid bruit is not present.  Cardiovascular: Normal rate and regular rhythm.   Pulmonary/Chest: Effort normal and breath sounds normal. No respiratory distress. She has no wheezes.  Abdominal: Soft. Bowel sounds are normal. She exhibits no distension and no mass.  Genitourinary: No breast swelling, tenderness, discharge or bleeding.  Musculoskeletal: Normal range of motion.  Lymphadenopathy:    She has no cervical adenopathy.  Neurological: She is alert. She has normal reflexes. Coordination normal.  Sitting in wheelchair  Skin: Skin is warm and dry. No rash noted. No erythema. No pallor.  Chronic stasis dermatitis, thick legs but trace edema Sacral decub- stage 1/2 improving Psychiatric: She has a normal mood and affect.       Assessment & Plan:

## 2014-02-19 ENCOUNTER — Other Ambulatory Visit: Payer: Self-pay

## 2014-02-19 NOTE — Telephone Encounter (Signed)
Ok to print out and put in my box for signature. 

## 2014-02-19 NOTE — Telephone Encounter (Signed)
Pt left v/m requesting rx hydrocodone apap. Call when ready for pick up.  

## 2014-02-20 ENCOUNTER — Other Ambulatory Visit: Payer: Self-pay | Admitting: Family Medicine

## 2014-02-22 ENCOUNTER — Other Ambulatory Visit: Payer: Self-pay | Admitting: Family Medicine

## 2014-02-23 ENCOUNTER — Telehealth: Payer: Self-pay | Admitting: *Deleted

## 2014-02-23 DIAGNOSIS — A0472 Enterocolitis due to Clostridium difficile, not specified as recurrent: Secondary | ICD-10-CM

## 2014-02-23 MED ORDER — HYDROCODONE-ACETAMINOPHEN 5-325 MG PO TABS: 1.0000 | ORAL_TABLET | Freq: Three times a day (TID) | ORAL | Status: DC | PRN

## 2014-02-23 NOTE — Telephone Encounter (Signed)
Pt needs to have hydrocodone rx picked up today; rx was due on 02/20/14.Please advise.

## 2014-02-23 NOTE — Telephone Encounter (Signed)
Spoke to pts daughter and informed her that's pts Rx is available for pickup; pt informed gov't issued photo id required for pickup.

## 2014-02-23 NOTE — Telephone Encounter (Signed)
Spoke to pts daughter who states that the pt has been experiencing vomiting since starting vancomycin and is wondering what further action should be taken, pls advice

## 2014-02-24 ENCOUNTER — Ambulatory Visit: Payer: Medicare Other | Admitting: Podiatry

## 2014-02-24 NOTE — Telephone Encounter (Signed)
Spoke to pts daughter Jackelyn Poling and advised her per Dr Deborra Medina; verbally expressed understanding

## 2014-02-24 NOTE — Telephone Encounter (Signed)
Yes, please decrease dose to three times daily.  I will also refer her to GI for further evaluation.  If vomiting persists after we decrease dose, please let me know ASAP.

## 2014-02-24 NOTE — Telephone Encounter (Signed)
Spoke to pts daughter Jackelyn Poling and informed her Rx has been faxed to requested pharmacy

## 2014-02-24 NOTE — Telephone Encounter (Signed)
Pt requesting medication refill. Last ov 01/2014, but I dont show that you prescribed this med. pls advise

## 2014-02-25 ENCOUNTER — Encounter: Payer: Self-pay | Admitting: Family Medicine

## 2014-02-25 ENCOUNTER — Encounter: Payer: Self-pay | Admitting: Internal Medicine

## 2014-02-25 ENCOUNTER — Ambulatory Visit (INDEPENDENT_AMBULATORY_CARE_PROVIDER_SITE_OTHER): Payer: Medicare Other | Admitting: Internal Medicine

## 2014-02-25 VITALS — BP 104/68 | HR 102 | Temp 97.9°F | Wt 163.0 lb

## 2014-02-25 DIAGNOSIS — R112 Nausea with vomiting, unspecified: Secondary | ICD-10-CM

## 2014-02-25 DIAGNOSIS — K5641 Fecal impaction: Secondary | ICD-10-CM

## 2014-02-25 DIAGNOSIS — R109 Unspecified abdominal pain: Secondary | ICD-10-CM

## 2014-02-25 MED ORDER — ONDANSETRON HCL 4 MG PO TABS: 4.0000 mg | ORAL_TABLET | Freq: Three times a day (TID) | ORAL | Status: DC | PRN

## 2014-02-25 NOTE — Progress Notes (Signed)
Subjective:    Patient ID: Kristina Mahoney, female    DOB: 1941-02-19, 73 y.o.   MRN: [id number]  HPI  Pt presents to the clinic today with c/o chills, dizziness, nausea and vomiting. She reports this started 4 days ago. She has not had a BM in the last 4 days. She is currently undergoing treatment for Cdiff with oral vancomycin (she has not tolerated flagyl in the past). She has had similar symptoms to this. She went to [facility] 3/18 with constipation, nausea, fever and chills. She was noted to have a UTI and fecal impaction. They did disimpact her in the ER and started her on keflex. She does have a history of DM2 but no documented gastroparesis. She is on Norco. She is taking that three times per day. She does take dulcolax. She has not been able to keep the dulcolax down for the last 2 days.  Review of Systems  Past Medical History  Diagnosis Date  . Diabetes mellitus without complication   . PVD (peripheral vascular disease)     Followed by Dr. Lucky Cowboy.  ABI on left leg is 0.72 (05/2013)  . Arthritis   . CAD (coronary artery disease)   . Colon polyps   . History of Clostridium difficile colitis 2013  . Hypertension   . Hyperlipidemia   . Depression   . Recurrent UTI   . Cataract   . MI (myocardial infarction)   . Skin cancer   . UTI (urinary tract infection)     Current Outpatient Prescriptions  Medication Sig Dispense Refill  . aspirin EC 81 MG tablet Take 81 mg by mouth daily.      . fluconazole (DIFLUCAN) 200 MG tablet Take 200 mg by mouth daily.      Marland Kitchen glucose blood (BAYER CONTOUR TEST) test strip Check blood sugar 4 x daily as needed.  Dx 250.00  150 each  5  . HYDROcodone-acetaminophen (NORCO/VICODIN) 5-325 MG per tablet Take 1 tablet by mouth 3 (three) times daily as needed for moderate pain.  30 tablet  0  . insulin aspart (NOVOLOG) 100 UNIT/ML injection Inject into the skin 3 (three) times daily as needed (Takes per sliding scale, only if blood sugar exceeds 150).      .  insulin glargine (LANTUS) 100 UNIT/ML injection Inject 15 Units into the skin daily.      . isosorbide mononitrate (IMDUR) 60 MG 24 hr tablet Take 60 mg by mouth 2 (two) times daily.      . isosorbide mononitrate (IMDUR) 60 MG 24 hr tablet TAKE 1 TABLET BY MOUTH TWICE DAILY.  60 tablet  3  . LYRICA 50 MG capsule TAKE ONE CAPSULE BY MOUTH TWICE A DAY  60 capsule  3  . nitrofurantoin (MACRODANTIN) 50 MG capsule Take 50 mg by mouth daily.      . Polyvinyl Alcohol-Povidone (REFRESH OP) Place 1 drop into both eyes daily as needed (for dry eyes).      . potassium chloride (K-DUR,KLOR-CON) 10 MEQ tablet Take 10 mEq by mouth 2 (two) times daily.      . rosuvastatin (CRESTOR) 40 MG tablet Take 40 mg by mouth daily.      . vancomycin (VANCOCIN) 250 MG capsule Take 1 capsule (250 mg total) by mouth 4 (four) times daily.  60 capsule  0   No current facility-administered medications for this visit.    Allergies  Allergen Reactions  . Morphine And Related Other (See Comments)  REACTION: hallucinations  . Tegretol [Carbamazepine] Nausea And Vomiting  . Trileptal [Oxcarbazepine] Nausea And Vomiting    Family History  Problem Relation Age of Onset  . Heart disease Mother   . Heart disease Father     History   Social History  . Marital Status: Single    Spouse Name: N/A    Number of Children: N/A  . Years of Education: N/A   Occupational History  . Not on file.   Social History Main Topics  . Smoking status: Never Smoker   . Smokeless tobacco: Not on file  . Alcohol Use: No  . Drug Use: No  . Sexual Activity: Not on file   Other Topics Concern  . Not on file   Social History Narrative   Dependent on her daughter for care, including complicated wound dressings.   Daughter seems very attentive.      Desires CPR, does not want long term life support.     Constitutional: Pt reports fatigue. Denies fever, malaise, headache or abrupt weight changes.  Respiratory: Denies difficulty  breathing, shortness of breath, cough or sputum production.   Cardiovascular: Denies chest pain, chest tightness, palpitations or swelling in the hands or feet.  Gastrointestinal: Pt reports abdominal pain, constipation, nausea and vomiting. Denies bloating, diarrhea or blood in the stool.  GU: Denies urgency, frequency, pain with urination, burning sensation, blood in urine, odor or discharge.  No other specific complaints in a complete review of systems (except as listed in HPI above).     Objective:   Physical Exam   BP 104/68  Pulse 102  Temp(Src) 97.9 F (36.6 C) (Oral)  Wt 163 lb (73.936 kg)  SpO2 98% Wt Readings from Last 3 Encounters:  02/25/14 163 lb (73.936 kg)  02/13/14 163 lb 12 oz (74.277 kg)  02/04/14 155 lb (70.308 kg)    General: Appears her stated age, ill appearing in NAD. Cardiovascular: Normal rate and rhythm. S1,S2 noted.  No murmur, rubs or gallops noted. No JVD or BLE edema. No carotid bruits noted. Pulmonary/Chest: Normal effort and positive vesicular breath sounds. No respiratory distress. No wheezes, rales or ronchi noted.  Abdomen: Soft and generally tender. Normal bowel sounds, no bruits noted. No distention or masses noted. Liver, spleen and kidneys non palpable. DRE: Decreased rectal tone, fecal impaction noted just inside the rectal vault.  BMET    Component Value Date/Time   NA 136* 02/04/2014 0852   K 3.6* 02/04/2014 0852   CL 97 02/04/2014 0852   CO2 16* 02/04/2014 0852   GLUCOSE 264* 02/04/2014 0852   BUN 20 02/04/2014 0852   CREATININE 0.89 02/04/2014 0852   CALCIUM 9.2 02/04/2014 0852   GFRNONAA 63* 02/04/2014 0852   GFRAA 73* 02/04/2014 0852    Lipid Panel     Component Value Date/Time   CHOL 135 06/02/2013 1252   TRIG 306.0* 06/02/2013 1252   HDL 38.60* 06/02/2013 1252   CHOLHDL 3 06/02/2013 1252   VLDL 61.2* 06/02/2013 1252    CBC    Component Value Date/Time   WBC 16.4* 02/04/2014 0852   RBC 4.13 02/04/2014 0852   HGB 12.9 02/04/2014  0852   HCT 37.0 02/04/2014 0852   PLT 161 02/04/2014 0852   MCV 89.6 02/04/2014 0852   MCH 31.2 02/04/2014 0852   MCHC 34.9 02/04/2014 0852   RDW 13.3 02/04/2014 0852   LYMPHSABS 0.7 02/04/2014 0852   MONOABS 1.2* 02/04/2014 0852   EOSABS 0.0 02/04/2014 8341  BASOSABS 0.0 02/04/2014 0852    Hgb A1C Lab Results  Component Value Date   HGBA1C 8.0* 06/02/2013        Assessment & Plan:   Nausea, vomiting, chills and abdominal pain secondary to fecal impaction:  Manual removal by provider I think this may be related to her Norco She is already taking dulcolax daily, add Mirilax daily Increase your fluid intake Continue PO vanc for CDiff infection eRx for zofran for nausea  RTC as needed if symptoms persist, if worse, go to ER immediately

## 2014-02-25 NOTE — Patient Instructions (Addendum)
Fecal Impaction A fecal impaction happens when there is a large, firm amount of stool (or feces) that cannot be passed. The impacted stool is usually in the rectum, which is the lowest part of the large bowel. The impacted stool can block the colon and cause significant problems. CAUSES  The longer stool stays in the rectum, the harder it gets. Anything that slows down your bowel movements can lead to fecal impaction, such as:  Constipation. This can be a long-standing (chronic) problem or can happen suddenly (acute).  Painful conditions of the rectum, such as hemorrhoids or anal fissures. The pain of these conditions can make you try to avoid having bowel movements.  Narcotic pain-relieving medicines, such as methadone, morphine, or codeine.  Not drinking enough fluids.  Inactivity and bed rest over long periods of time.  Diseases of the brain or nervous system that damage the nerves controlling the muscles of the intestines. SIGNS AND SYMPTOMS   Lack of normal bowel movements or changes in bowel patterns.  Sense of fullness in the rectum but unable to pass stool.  Pain or cramps in the abdominal area (often after meals).  Thin, watery discharge from the rectum. DIAGNOSIS  Your health care provider may suspect that you have a fecal impaction based on your symptoms and a physical exam. This will include an exam of your rectum. Sometimes X-rays or lab testing may be needed to confirm the diagnosis and to be sure there are no other problems.  TREATMENT   Initially an impaction can be removed manually. Using a gloved finger, your health care provider can remove hard stool from your rectum.  Medicine is sometimes needed. A suppository or enema can be given in the rectum to soften the stool, which can stimulate a bowel movement. Medicines can also be given by mouth (orally).  Though rare, surgery may be needed if the colon has torn (perforated) due to blockage. HOME CARE INSTRUCTIONS    Develop regular bowel habits. This could include getting in the habit of having a bowel movement after your morning cup of coffee or after eating. Be sure to allow yourself enough time on the toilet.  Maintain a high-fiber diet.  Drink enough fluids to keep your urine clear or pale yellow as directed by your health care provider.  Exercise regularly.  If you begin to get constipated, increase the amount of fiber in your diet. Eat plenty of fruits, vegetables, whole wheat breads, bran, oatmeal, and similar products.  Take natural fiber laxatives or other laxatives only as directed by your health care provider. SEEK MEDICAL CARE IF:   You have ongoing rectal pain.  You require enemas or suppositories more than twice a week.  You have rectal bleeding.  You have continued problems, or you develop abdominal pain.  You have thin, pencil-like stools. SEEK IMMEDIATE MEDICAL CARE IF:  You have black or tarry stools. MAKE SURE YOU:   Understand these instructions.  Will watch your condition.  Will get help right away if you are not doing well or get worse. Document Released: 07/29/2004 Document Revised: 08/27/2013 Document Reviewed: 05/13/2013 [facility] Patient Information 2014 [place].

## 2014-02-25 NOTE — Progress Notes (Signed)
Pre visit review using our clinic review tool, if applicable. No additional management support is needed unless otherwise documented below in the visit note. 

## 2014-03-05 ENCOUNTER — Encounter: Payer: Self-pay | Admitting: Surgery

## 2014-03-05 ENCOUNTER — Telehealth: Payer: Self-pay

## 2014-03-05 NOTE — Telephone Encounter (Signed)
Prior Josem Kaufmann has been faxed to [place] today--waiting on response

## 2014-03-09 ENCOUNTER — Other Ambulatory Visit: Payer: Self-pay

## 2014-03-09 MED ORDER — HYDROCODONE-ACETAMINOPHEN 5-325 MG PO TABS: 1.0000 | ORAL_TABLET | Freq: Three times a day (TID) | ORAL | Status: DC | PRN

## 2014-03-09 NOTE — Telephone Encounter (Signed)
Spoke to pt and informed her Rx is at the front desk and available for pickup.

## 2014-03-09 NOTE — Telephone Encounter (Signed)
Pt left note requesting rx hydrocodone apap.call when ready for pick up.

## 2014-03-16 NOTE — Telephone Encounter (Signed)
Left detailed msg on VM letting pt know Zofran has been approved through her insurance until--03/13/15

## 2014-03-19 ENCOUNTER — Other Ambulatory Visit: Payer: Self-pay

## 2014-03-19 NOTE — Telephone Encounter (Signed)
Pt left v/m requesting rx hydrocodone apap. Call when ready for pick up. Pt thinks that 03/09/14 was partial refill.Please advise.

## 2014-03-20 ENCOUNTER — Encounter: Payer: Self-pay | Admitting: Surgery

## 2014-03-20 ENCOUNTER — Telehealth: Payer: Self-pay | Admitting: *Deleted

## 2014-03-20 MED ORDER — HYDROCODONE-ACETAMINOPHEN 5-325 MG PO TABS: 1.0000 | ORAL_TABLET | Freq: Three times a day (TID) | ORAL | Status: DC | PRN

## 2014-03-20 NOTE — Telephone Encounter (Signed)
Spoke to pts daughter and informed her Rx will be available for pickup after 1300. Sig changed to reflect BID. Kristina Mahoney advised this Rx will be for #30 also as a continuation of previous Rx and all future Rx will be #60 BID

## 2014-03-20 NOTE — Telephone Encounter (Signed)
Pts daugher Jackelyn Poling called and stated that pt only received 30 tabs the last two prescriptions she received. Jackelyn Poling is requesting another Rx for the remaining 60tabs as the pt has used all of #30 since 03/09/14. I explained to pt that per [facility], pts insurance may not cover her receiving 90 TID due to her age and fall risk. pls advise

## 2014-03-20 NOTE — Telephone Encounter (Signed)
Due to her age, they wont cover it prn TID. They will only cover #60 prn TID or  BID #60

## 2014-03-20 NOTE — Telephone Encounter (Signed)
Ok to change to #60 bid prn.

## 2014-03-20 NOTE — Telephone Encounter (Signed)
Ok to print and put in my box for signature. 

## 2014-03-20 NOTE — Telephone Encounter (Signed)
Opened in error

## 2014-03-20 NOTE — Telephone Encounter (Signed)
Rx printed from previous request and placed in Dr Hulen Shouts inbox for signature

## 2014-03-20 NOTE — Telephone Encounter (Signed)
Ok to receive as requested if patient's insurance will cover.

## 2014-03-23 ENCOUNTER — Other Ambulatory Visit: Payer: Self-pay | Admitting: Family Medicine

## 2014-03-24 ENCOUNTER — Telehealth: Payer: Self-pay

## 2014-03-24 NOTE — Telephone Encounter (Signed)
Crystal said her contact # for pts daughter would not be usable until after 5 pm and Crystal can get doppler done and read today. Dr Damita Dunnings said thinks best care  for pt to be taken to ED for eval. Crystal said she will make contact with family.

## 2014-03-24 NOTE — Telephone Encounter (Signed)
Give the complexity of the complaints, I would get her to ER.  Routed to PCP as FYI.

## 2014-03-24 NOTE — Telephone Encounter (Signed)
Crystal nurse with [facility] request verbal orders for 1) recertification for home health nursing to continue seeing pt 2)pt has had problems with lt leg but pt now having new and different pain in lt leg; lt leg is swollen more than rt leg and lt leg is more discolored than usual; request order for doppler to r/u blood clot; 3)pt having a lot of pain, pt is teary eyed and Crystal wants to know about changing pain med;4)pt has stage II  1x1 pressure sore on buttock; duoderm was placed and Crystal wants to know if that is OK and if so needs order for duoderm,5) finally ?UTI and Crystal wants to know if dr will order U/A and C&S.  Dr Deborra Medina out of office and [place] request cb today.

## 2014-03-24 NOTE — Telephone Encounter (Signed)
If the is a concern for a blood clot, then I would advise ER.

## 2014-04-01 ENCOUNTER — Encounter: Payer: Self-pay | Admitting: Family Medicine

## 2014-04-08 ENCOUNTER — Telehealth: Payer: Self-pay

## 2014-04-08 NOTE — Telephone Encounter (Signed)
I am really not comfortable changing pain meds in a patient I have never seen. Dr Deborra Medina is at [facility] can try calling her on her cell to see if she has any ideas

## 2014-04-08 NOTE — Telephone Encounter (Signed)
Spoke with Dr Deborra Medina and she suggested pt taking Vicodin 5-325 mg one tab twice a day as already on med list,and for pt or family or HH to call back with update on how pt doing. Christy voiced understanding and also asked if Dr Deborra Medina could prescribe med for depression, pt is tearful; Alyse Low said no SI/HI but pt has been hurting for awhile, pt is at home alone during the day due to daughter going back to work. [facility] [place] [facility] request cb and said OK if cb is on another day.

## 2014-04-08 NOTE — Telephone Encounter (Signed)
Christy with [facility] at pts home now;  Last few days pt has severe pain in face, neck, legs and basically all over due to fibromyalgia; pt taking tylenol and other OTC meds with no relief; pt takes one Vicodin at night. Pt is not getting out of bed due to pain; pain level 10. Pt not wanting to eat due to pain. Alyse Low wants to know if can have different pain med; pt has pressure sore on buttock and does not need to remain in bed.Alyse Low said urine specimen taken to urologist office last week so pt probably has UTI as well; burning upon urination and frequency. Alyse Low will try to contact urologist for further directions on UTI. Alyse Low said pts daughter could pick up rx. Christy request answer now due to pts pain level.

## 2014-04-09 DIAGNOSIS — L8992 Pressure ulcer of unspecified site, stage 2: Secondary | ICD-10-CM

## 2014-04-09 DIAGNOSIS — L8993 Pressure ulcer of unspecified site, stage 3: Secondary | ICD-10-CM

## 2014-04-09 DIAGNOSIS — E119 Type 2 diabetes mellitus without complications: Secondary | ICD-10-CM

## 2014-04-09 DIAGNOSIS — L89309 Pressure ulcer of unspecified buttock, unspecified stage: Secondary | ICD-10-CM

## 2014-04-10 ENCOUNTER — Encounter: Payer: Self-pay | Admitting: Gastroenterology

## 2014-04-10 MED ORDER — CITALOPRAM HYDROBROMIDE 20 MG PO TABS: 20.0000 mg | ORAL_TABLET | Freq: Every day | ORAL | Status: DC

## 2014-04-10 NOTE — Telephone Encounter (Signed)
Lm on

## 2014-04-10 NOTE — Telephone Encounter (Signed)
Lm on Kristina Mahoney's vm informing her Rx has been sent to requested pharmacy and requested pt update [place] within a couple weeks

## 2014-04-10 NOTE — Telephone Encounter (Signed)
We could certainly prescribe a medication for depression.  Has she even been on any antidepressants?  I will send in rx for celexa to her pharmacy.  Please update me on her symptoms in a few weeks as it can take weeks to see an effect.

## 2014-04-20 ENCOUNTER — Encounter: Payer: Self-pay | Admitting: Family Medicine

## 2014-04-20 ENCOUNTER — Telehealth: Payer: Self-pay | Admitting: Family Medicine

## 2014-04-20 ENCOUNTER — Ambulatory Visit (INDEPENDENT_AMBULATORY_CARE_PROVIDER_SITE_OTHER): Payer: Medicare Other | Admitting: Family Medicine

## 2014-04-20 VITALS — BP 118/64 | HR 84 | Temp 98.5°F | Ht 65.0 in

## 2014-04-20 DIAGNOSIS — Z8719 Personal history of other diseases of the digestive system: Secondary | ICD-10-CM

## 2014-04-20 DIAGNOSIS — T50905A Adverse effect of unspecified drugs, medicaments and biological substances, initial encounter: Secondary | ICD-10-CM

## 2014-04-20 DIAGNOSIS — R112 Nausea with vomiting, unspecified: Secondary | ICD-10-CM | POA: Insufficient documentation

## 2014-04-20 DIAGNOSIS — T887XXA Unspecified adverse effect of drug or medicament, initial encounter: Secondary | ICD-10-CM

## 2014-04-20 DIAGNOSIS — IMO0001 Reserved for inherently not codable concepts without codable children: Secondary | ICD-10-CM

## 2014-04-20 DIAGNOSIS — R197 Diarrhea, unspecified: Secondary | ICD-10-CM

## 2014-04-20 LAB — BASIC METABOLIC PANEL
BUN: 20 mg/dL (ref 6–23)
CHLORIDE: 104 meq/L (ref 96–112)
CO2: 19 mEq/L (ref 19–32)
Calcium: 9.5 mg/dL (ref 8.4–10.5)
Creatinine, Ser: 1 mg/dL (ref 0.4–1.2)
GFR: 60.54 mL/min (ref >60.00)
Glucose, Bld: 279 mg/dL — ABNORMAL HIGH (ref 70–99)
Potassium: 4.6 mEq/L (ref 3.5–5.1)
Sodium: 137 mEq/L (ref 135–145)

## 2014-04-20 LAB — CBC WITH DIFFERENTIAL/PLATELET
BASOS PCT: 3.3 % — AB (ref 0.0–3.0)
Basophils Absolute: 0.7 10*3/uL — ABNORMAL HIGH (ref 0.0–0.1)
EOS ABS: 0 10*3/uL (ref 0.0–0.7)
Eosinophils Relative: 0 % (ref 0.0–5.0)
HCT: 40.7 % (ref 36.0–46.0)
HEMOGLOBIN: 13.5 g/dL (ref 12.0–15.0)
Lymphs Abs: 0.4 10*3/uL — ABNORMAL LOW (ref 0.7–4.0)
MCHC: 33.2 g/dL (ref 30.0–36.0)
MCV: 91.7 fl (ref 78.0–100.0)
MONOS PCT: 3.5 % (ref 3.0–12.0)
Monocytes Absolute: 0.7 10*3/uL (ref 0.1–1.0)
NEUTROS ABS: 17.9 10*3/uL — AB (ref 1.4–7.7)
Neutrophils Relative %: 91.1 % — ABNORMAL HIGH (ref 43.0–77.0)
Platelets: 299 10*3/uL (ref 150.0–400.0)
RBC: 4.44 Mil/uL (ref 3.87–5.11)
RDW: 14.4 % (ref 11.5–15.5)

## 2014-04-20 MED ORDER — METRONIDAZOLE 500 MG PO TABS: 500.0000 mg | ORAL_TABLET | Freq: Three times a day (TID) | ORAL | Status: DC

## 2014-04-20 MED ORDER — HYDROCODONE-ACETAMINOPHEN 5-325 MG PO TABS: 1.0000 | ORAL_TABLET | Freq: Two times a day (BID) | ORAL | Status: DC | PRN

## 2014-04-20 NOTE — Assessment & Plan Note (Signed)
Suspect multifactorial Hold celexa as pt noticed nausea worsened on this Suspect poss c diff  zofran prn  Lab and c diff test

## 2014-04-20 NOTE — Assessment & Plan Note (Signed)
Suspect celexa is worsening nausea - has not been on it long  Will hold this and f/u with PCP

## 2014-04-20 NOTE — Telephone Encounter (Signed)
Hope at the lab called critical lab WBC of 19.7, she will fax it to [place] too

## 2014-04-20 NOTE — Progress Notes (Signed)
Pre visit review using our clinic review tool, if applicable. No additional management support is needed unless otherwise documented below in the visit note. 

## 2014-04-20 NOTE — Assessment & Plan Note (Signed)
Strongly suspect c diff Pt will give stool sample and then begin metronizadole 500 tid  Cbc and bmet today Stable vitals and no signs of dehydration on exam

## 2014-04-20 NOTE — Telephone Encounter (Signed)
Please let family know her wbc is high so I do suspect she has an infection (c diff is possible)-so please get the stool sample in as soon as they can (if not done already) and start the flagyl  If something prevents them from getting a sample-please just start the flagyl

## 2014-04-20 NOTE — Telephone Encounter (Signed)
Left voicemail requesting Kristina Mahoney's daughter to call office  Called Kristina Mahoney and advise her of labs results and Dr. Marliss Coots comments. Kristina Mahoney advise to get stool sample in as soon as she can so she can start taking the flagyl

## 2014-04-20 NOTE — Assessment & Plan Note (Signed)
Suspect return- with diarrhea/weakness/ n/v and low grade fever c diff test  Cbc today  Then will begin metronizadole Rev s/s of dehydration re: when to go to ER

## 2014-04-20 NOTE — Progress Notes (Signed)
Subjective:    Patient ID: Kristina Mahoney, female    DOB: 07/28/1941, 73 y.o.   MRN: [id number]  HPI Here with n/v   Family thinks she may still have c diff - had it in march -- took vanco and never got 100% better  Now diarrhea last night  Vomiting started this am  No blood  occ mucous and a particular smell   Has a GI appt in July   Thinks celexa is causing dizziness-just started it   She feels hot and cold  Now having some low grade fever   Her prev impaction was resolved and miralax helped a lot   Patient Active Problem List   Diagnosis Date Noted  . Adverse effects of medication 04/20/2014  . Diarrhea 04/20/2014  . History of Clostridium difficile 04/20/2014  . Nausea with vomiting 04/20/2014  . Fecal impaction 02/13/2014  . Enteritis due to Clostridium difficile 02/13/2014  . Physical deconditioning 01/14/2014  . Acute upper respiratory infections of unspecified site 01/14/2014  . Respiratory sounds, abnormal 11/24/2013  . Hypokalemia 11/24/2013  . Sensation of feeling hot 11/06/2013  . Yeast vaginitis 09/17/2013  . Hypotension 09/08/2013  . Mobitz type 1 second degree AV block 06/23/2013  . Type II or unspecified type diabetes mellitus with peripheral circulatory disorders, uncontrolled(250.72) 06/02/2013  . CAD (coronary artery disease) of artery bypass graft 06/02/2013  . OA (osteoarthritis) 06/02/2013  . PVD (peripheral vascular disease) 06/02/2013  . Pressure ulcer, heel 06/02/2013  . Ulcer of left heel 06/02/2013  . Sacral decubitus ulcer, stage II 06/02/2013  . Diabetic peripheral neuropathy 06/02/2013  . Hyperlipidemia   . Depression   . Recurrent UTI    Past Medical History  Diagnosis Date  . Diabetes mellitus without complication   . PVD (peripheral vascular disease)     Followed by Dr. Lucky Cowboy.  ABI on left leg is 0.72 (05/2013)  . Arthritis   . CAD (coronary artery disease)   . Colon polyps   . History of Clostridium difficile colitis 2013  .  Hypertension   . Hyperlipidemia   . Depression   . Recurrent UTI   . Cataract   . MI (myocardial infarction)   . Skin cancer   . UTI (urinary tract infection)    Past Surgical History  Procedure Laterality Date  . Cholecystectomy    . Cataract extraction    . Cardiac catheterization  2001    [place]; [facility];   . Coronary artery bypass graft      [place]  . Peripheral arterial stent graft     History  Substance Use Topics  . Smoking status: Never Smoker   . Smokeless tobacco: Not on file  . Alcohol Use: No   Family History  Problem Relation Age of Onset  . Heart disease Mother   . Heart disease Father    Allergies  Allergen Reactions  . Morphine And Related Other (See Comments)    REACTION: hallucinations  . Tegretol [Carbamazepine] Nausea And Vomiting  . Trileptal [Oxcarbazepine] Nausea And Vomiting   Current Outpatient Prescriptions on File Prior to Visit  Medication Sig Dispense Refill  . aspirin EC 81 MG tablet Take 81 mg by mouth daily.      Marland Kitchen glucose blood (BAYER CONTOUR TEST) test strip Check blood sugar 4 x daily as needed.  Dx 250.00  150 each  5  . insulin aspart (NOVOLOG) 100 UNIT/ML injection Inject into the skin 3 (  three) times daily as needed (Takes per sliding scale, only if blood sugar exceeds 150).      . insulin glargine (LANTUS) 100 UNIT/ML injection Inject 15 Units into the skin daily.      . isosorbide mononitrate (IMDUR) 60 MG 24 hr tablet TAKE 1 TABLET BY MOUTH TWICE DAILY.  60 tablet  3  . KLOR-CON 10 10 MEQ tablet TAKE 1 TABLET (10 MEQ TOTAL) BY MOUTH ONCE.  30 tablet  5  . LYRICA 50 MG capsule TAKE ONE CAPSULE BY MOUTH TWICE A DAY  60 capsule  3  . nitrofurantoin (MACRODANTIN) 50 MG capsule Take 50 mg by mouth daily.      . ondansetron (ZOFRAN) 4 MG tablet Take 1 tablet (4 mg total) by mouth every 8 (eight) hours as needed.  20 tablet  0  . Polyvinyl Alcohol-Povidone (REFRESH OP) Place 1 drop into both  eyes daily as needed (for dry eyes).      . rosuvastatin (CRESTOR) 40 MG tablet Take 40 mg by mouth daily.       No current facility-administered medications on file prior to visit.     Review of Systems    Review of Systems  Constitutional: pos for malaise and low grade fever  Eyes: Negative for pain and visual disturbance.  Respiratory: Negative for cough and shortness of breath.   Cardiovascular: Negative for cp or palpitations    Gastrointestinal: Negative for nausea and constipation. pos for watery diarrhea and gas/ neg for abd pain or blood in urine  Genitourinary: Negative for urgency and frequency.  Skin: Negative for pallor or rash   Neurological: Negative for weakness, light-headedness, numbness and headaches.  Hematological: Negative for adenopathy. Does not bruise/bleed easily.  Psychiatric/Behavioral: pos for dysphoric mood/ depression       Objective:   Physical Exam  Constitutional: She appears well-developed and well-nourished.  Miserable-sitting in a wheelchair and vomiting Answers questions appropriately  HENT:  Head: Normocephalic and atraumatic.  Mouth/Throat: Oropharynx is clear and moist.  Eyes: Conjunctivae and EOM are normal. Pupils are equal, round, and reactive to light. No scleral icterus.  Neck: Normal range of motion. Neck supple.  Cardiovascular: Normal rate and regular rhythm.   Pulmonary/Chest: Effort normal and breath sounds normal. No respiratory distress. She has no wheezes. She has no rales.  No crackles   Abdominal: Soft. She exhibits no distension and no mass. There is no tenderness. There is no rebound and no guarding.  Mildly hyperactive bs -not high pitched or tinkling   Pt has several episodes of dry heaving in the office   Lymphadenopathy:    She has no cervical adenopathy.  Neurological: She is alert. She has normal reflexes. No cranial nerve deficit. Coordination normal.  Skin: Skin is warm and dry. No rash noted. No erythema.    Psychiatric: She has a normal mood and affect.          Assessment & Plan:

## 2014-04-20 NOTE — Patient Instructions (Signed)
Hold the celexa  Lab today and c diff test  Hold the px for flagyl until you get the stool specimen in  Try the zofran you have for nausea/vomiting

## 2014-04-22 ENCOUNTER — Other Ambulatory Visit: Payer: Self-pay | Admitting: Family Medicine

## 2014-04-22 NOTE — Addendum Note (Signed)
Addended by: Daralene Milch C on: 04/22/2014 11:44 AM   Modules accepted: Orders

## 2014-04-22 NOTE — Telephone Encounter (Signed)
Debbie pts daughter left v/m returning call; Jackelyn Poling will try to cb later.

## 2014-04-22 NOTE — Telephone Encounter (Signed)
Left detailed voicemail letting daugther know Dr. Marliss Coots comments/recommendations about pt's labs

## 2014-04-23 LAB — C. DIFFICILE GDH AND TOXIN A/B
C. DIFF TOXIN A/B: NOT DETECTED
C. difficile GDH: NOT DETECTED

## 2014-04-28 ENCOUNTER — Telehealth: Payer: Self-pay | Admitting: *Deleted

## 2014-04-28 NOTE — Telephone Encounter (Signed)
I will also forward this to Dr Deborra Medina so she is aware  With the hot and cold spells it may be wise to have them take her temp to see if she has a fever Thanks

## 2014-04-28 NOTE — Telephone Encounter (Signed)
Called pt and she said she is still weak, having hot flashes then chills, and she isn't vomiting but still having a lot of GERD and it is making it hard to eat. I advise pt that she shouldn't wait until the end of the month to see Dr. Deborra Medina and would she like me to r/s appt to an earlier date. Pt said she will have her daughter call us back and r/s appt to a day that her daughter can bring her in to the office.

## 2014-04-28 NOTE — Telephone Encounter (Signed)
Message copied by Tammi Sou on Tue Apr 28, 2014  2:58 PM ------      Message from: Loura Pardon A      Created: Mon Apr 27, 2014  1:49 PM       Would you please call this pt and check in on her re: how she is feeling? - she does not have appt with Dr Deborra Medina until end of the month-thanks      ----- Message -----         From: SYSTEM         Sent: 04/27/2014  12:01 AM           To: Abner Greenspan, MD                   ------

## 2014-04-30 NOTE — Telephone Encounter (Signed)
Left voicemail on daughter's phone letting her know Dr. Glori Bickers wants pt to see Dr. Deborra Medina sooner then the 29th and requested daughter to call office and reschedule pt's appt., and also since pt has been having bouts of hot flashes then chills she may want to take pt's tem to make sure she doesn't have a fever.

## 2014-05-18 ENCOUNTER — Encounter: Payer: Self-pay | Admitting: Family Medicine

## 2014-05-18 ENCOUNTER — Ambulatory Visit (INDEPENDENT_AMBULATORY_CARE_PROVIDER_SITE_OTHER): Payer: Medicare Other | Admitting: Family Medicine

## 2014-05-18 VITALS — BP 102/68 | HR 96 | Temp 97.5°F | Wt 159.0 lb

## 2014-05-18 DIAGNOSIS — R197 Diarrhea, unspecified: Secondary | ICD-10-CM

## 2014-05-18 DIAGNOSIS — N390 Urinary tract infection, site not specified: Secondary | ICD-10-CM

## 2014-05-18 DIAGNOSIS — R6889 Other general symptoms and signs: Secondary | ICD-10-CM

## 2014-05-18 MED ORDER — CIPROFLOXACIN HCL 250 MG PO TABS: 250.0000 mg | ORAL_TABLET | Freq: Two times a day (BID) | ORAL | Status: AC

## 2014-05-18 MED ORDER — HYDROCODONE-ACETAMINOPHEN 5-325 MG PO TABS: 1.0000 | ORAL_TABLET | Freq: Two times a day (BID) | ORAL | Status: DC | PRN

## 2014-05-18 NOTE — Assessment & Plan Note (Signed)
She was unable to leave a urine sample.  Probable UTI given symptoms. Will treat with cipro, continue probiotics given h/o c diff.

## 2014-05-18 NOTE — Assessment & Plan Note (Signed)
Resolved. Likely multifactorial.

## 2014-05-18 NOTE — Progress Notes (Signed)
   Subjective:   Patient ID: Kristina Mahoney, female    DOB: 1941-06-16, 73 y.o.   MRN: [id number]  Kristina Mahoney is a pleasant 73 y.o. year old female who presents to clinic today with Follow-up and Dysuria  on 05/18/2014  HPI: Saw Dr. Glori Bickers on 6/1 for n/v/d. Note reviewed- C. Diff neg. Had started celexa a week or two prior.  Advised to stop this, prn zofran for nausea.  Feels a little better but still having hot and cold flashes.  No longer having n/v/d but still having hot flashes.  They improved initially and now returned last week. Also having increased urinary frequency and dysuria.  Usually does not get a fever even with high WBC.  Lab Results  Component Value Date   WBC 19.7 cH* 04/20/2014   HGB 13.5 04/20/2014   HCT 40.7 04/20/2014   MCV 91.7 04/20/2014   PLT 299.0 04/20/2014   Lab Results  Component Value Date   NA 137 04/20/2014   K 4.6 04/20/2014   CL 104 04/20/2014   CO2 19 04/20/2014   Lab Results  Component Value Date   CREATININE 1.0 04/20/2014    Review of Systems See HPI No back pain    Objective:    BP 102/68  Pulse 96  Temp(Src) 97.5 F (36.4 C) (Oral)  Wt 159 lb (72.122 kg)  SpO2 98%   Physical Exam  Nursing note and vitals reviewed. Constitutional: She appears well-developed and well-nourished. No distress.  HENT:  Head: Normocephalic.  Neck: Normal range of motion.  Abdominal: She exhibits no distension and no mass. There is no tenderness. There is no rebound and no guarding.  Musculoskeletal: She exhibits no tenderness.  Skin: Skin is warm.  Psychiatric: She has a normal mood and affect. Her behavior is normal. Judgment and thought content normal.          Assessment & Plan:   Diarrhea  Sensation of feeling hot  Recurrent UTI No Follow-up on file.

## 2014-05-18 NOTE — Patient Instructions (Signed)
Please take cipro as directed. Continue probiotic.

## 2014-05-18 NOTE — Progress Notes (Signed)
Pre visit review using our clinic review tool, if applicable. No additional management support is needed unless otherwise documented below in the visit note. 

## 2014-05-27 ENCOUNTER — Telehealth: Payer: Self-pay | Admitting: Gastroenterology

## 2014-05-27 ENCOUNTER — Ambulatory Visit: Payer: Medicare Other | Admitting: Gastroenterology

## 2014-05-27 NOTE — Telephone Encounter (Signed)
Do you want to charge? 

## 2014-05-27 NOTE — Telephone Encounter (Signed)
No charge this time. 

## 2014-05-28 ENCOUNTER — Telehealth: Payer: Self-pay

## 2014-05-28 ENCOUNTER — Other Ambulatory Visit: Payer: Self-pay | Admitting: Internal Medicine

## 2014-05-28 MED ORDER — SULFAMETHOXAZOLE-TMP DS 800-160 MG PO TABS: 1.0000 | ORAL_TABLET | Freq: Two times a day (BID) | ORAL | Status: DC

## 2014-05-28 NOTE — Telephone Encounter (Signed)
Lm on Kristina Mahoney's vm informing her Rx has been called in to pharmacy for pt

## 2014-05-28 NOTE — Telephone Encounter (Signed)
I have called in Septra DS for her

## 2014-05-28 NOTE — Telephone Encounter (Signed)
Kristina Mahoney with [facility] left v/m;pt finished antibiotic,Cipro given by Dr Deborra Medina for possible UTI; pt is still experiencing burning and pain upon urination; pts daughter does not have transportation to bring pt to the doctor. Pt does not have appt with urologist in [place]. Kristina Mahoney does not want pt off antibiotic with continuing symptoms. Kristina Mahoney request cb.

## 2014-06-03 ENCOUNTER — Telehealth: Payer: Self-pay | Admitting: Family Medicine

## 2014-06-03 NOTE — Telephone Encounter (Signed)
Diabetic Bundle.  Pt needs lab appt for A1C.  Left message with someone in the home for pt to return call.

## 2014-06-04 NOTE — Telephone Encounter (Signed)
[name] called and says she has to bring pt but her car is in the shop. She will call to schedule lab apptmt once car is repaired.

## 2014-06-07 ENCOUNTER — Other Ambulatory Visit: Payer: Self-pay | Admitting: Family Medicine

## 2014-06-10 MED ORDER — SULFAMETHOXAZOLE-TMP DS 800-160 MG PO TABS: 1.0000 | ORAL_TABLET | Freq: Two times a day (BID) | ORAL | Status: AC

## 2014-06-10 NOTE — Telephone Encounter (Signed)
No urine cx in chart. Will extend course of bactrim. [facility]

## 2014-06-10 NOTE — Addendum Note (Signed)
Addended by: Lucille Passy on: 06/10/2014 01:39 PM   Modules accepted: Orders

## 2014-06-10 NOTE — Telephone Encounter (Signed)
Debbie pts daughter said pt finished antibiotic over the weekend and symptoms of burning upon urination and frequency were much better; Jackelyn Poling is not sure if symptoms completely cleared or not. But since finishing antibiotic burning upon urination and frequency has restarted. Pt has no fever or confusion. Debbie said her car is still not working and request antibiotic to [place] request cb. Jackelyn Poling has not made appt with urologist in [place] due to car problems.

## 2014-06-11 ENCOUNTER — Telehealth: Payer: Self-pay

## 2014-06-11 NOTE — Telephone Encounter (Signed)
Kristina Mahoney with [facility] left v/m; orders were faxed for signature by doctor and have not received signed orders back. Kristina Mahoney will refax order request for signature.

## 2014-06-16 ENCOUNTER — Other Ambulatory Visit: Payer: Self-pay

## 2014-06-16 MED ORDER — HYDROCODONE-ACETAMINOPHEN 5-325 MG PO TABS: 1.0000 | ORAL_TABLET | Freq: Two times a day (BID) | ORAL | Status: DC | PRN

## 2014-06-16 NOTE — Telephone Encounter (Signed)
Kristina Mahoney left v/m requesting rx hydrocodone apap. Call when ready for pick up.Pt has lab appt on 06/19/14 at 2 pm and will pick up then.

## 2014-06-17 MED ORDER — HYDROCODONE-ACETAMINOPHEN 5-325 MG PO TABS: 1.0000 | ORAL_TABLET | Freq: Two times a day (BID) | ORAL | Status: AC | PRN

## 2014-06-17 NOTE — Addendum Note (Signed)
Addended by: Modena Nunnery on: 06/17/2014 03:06 PM   Modules accepted: Orders

## 2014-06-17 NOTE — Telephone Encounter (Signed)
Rx placed at the front desk for pick up.

## 2014-06-19 ENCOUNTER — Other Ambulatory Visit (INDEPENDENT_AMBULATORY_CARE_PROVIDER_SITE_OTHER): Payer: Medicare Other

## 2014-06-19 DIAGNOSIS — E1159 Type 2 diabetes mellitus with other circulatory complications: Secondary | ICD-10-CM

## 2014-06-19 LAB — HEMOGLOBIN A1C: Hgb A1c MFr Bld: 7.8 % — ABNORMAL HIGH (ref 4.6–6.5)

## 2014-11-20 DEATH — deceased

## 2015-03-12 NOTE — H&P (Signed)
PATIENT NAME:  Kristina Mahoney, Kristina Mahoney MR#:  [id number] DATE OF BIRTH:  [birth year]  DATE OF ADMISSION:  09/08/2013  PRIMARY CARE PHYSICIAN: Dr. Arnette Norris.    REFERRING PHYSICIAN: Dr. Joni Fears.   CHIEF COMPLAINT: Hypotension.   HISTORY OF PRESENT ILLNESS: Kristina Mahoney is a 74 year old female with history of diabetes mellitus insulin dependent, recurrent UTI, coronary artery disease, chronic kidney disease. Has been experiencing severe generalized weakness for the last 1 month. The patient was seen by her primary care physician and was diagnosed with a urinary tract infection. Per the patient, the patient was given 2 courses of antibiotics. However, per the patient, the patient was told that the urinary tract infection was from this and the antibiotics were changed to fluconazole. The patient was given 2 weeks of fluconazole. The patient went to see her cardiologist today and the patient was found to be hypotensive. Concerning this, the patient was sent to the Emergency Department. Workup in the Emergency Department, the patient initially had systolic blood pressure in the 70s. Was given 1 liter fluid bolus and was kept on maintenance fluids of 150 mL. The patient did not have any further episodes of hypotension. Urinalysis is strongly positive for a urinary tract infection. The patient was given Zosyn in the Emergency Department. Urine cultures were obtained.   PAST MEDICAL HISTORY:  1. Hypertension.  2. Hyperlipidemia.  3. Diabetes mellitus, insulin dependent.  4. Recurrent urinary tract infections.  5. Coronary artery disease, status post coronary artery bypass grafting.  6. Bilateral heel ulcers, left more than the right.  7. Peripheral vascular disease.  8. Trigeminal neuralgia.   ALLERGIES:  1. MORPHINE.  2. TEGRETOL.  3. TRILEPTAL.    HOME MEDICATIONS:  1. NovoLog on sliding scale insulin.  2. Lyrica 50 mg 2 times a day.  3. Lantus 10 to 15 units daily.  4. Klor-Con 10 mEq 2 times a day.  5.  Imdur 60 mg once a day.  6. Diflucan 200 mg once a day.  7. Crestor 40 mg once a day.  8. Cipro 500 mg 2 times a day.  9. Aspirin 81 mg once a day.  10. Norco 5/325 mg 3 times a day.   SOCIAL HISTORY: No history of smoking, drinking alcohol or using illicit drugs. Lives with her daughter.   FAMILY HISTORY: History of diabetes mellitus, congestive heart failure and MI in mother.   REVIEW OF SYSTEMS:  CONSTITUTIONAL: Severe generalized weakness, fatigue.  EYES: No change in vision.  ENT: No change in hearing.  RESPIRATORY: No cough, shortness of breath.  CARDIOVASCULAR: No chest pain, palpitations.  GASTROINTESTINAL: Decreased appetite. No nausea, vomiting, abdominal pain.  GENITOURINARY: No dysuria. The patient has been having frequent urination.  ENDOCRINE: The patient carries a diagnosis of diabetes mellitus.  HEMATOLOGIC: No easy bruising or bleeding.  SKIN: No rashes or lesions.  MUSCULOSKELETAL: Has osteoarthritis.  NEUROLOGIC: Has trigeminal neuralgia and peripheral nephropathy. No  or numbness in any part of the body.   PHYSICAL EXAMINATION:  GENERAL: This is an elderly-looking female lying down in the bed, not in distress.  VITAL SIGNS: Temperature 97.7, pulse 57, blood pressure 131/54, respiratory rate of 18, oxygen saturation is 96% on room air.  HEENT: Head normocephalic, atraumatic. There is no scleral icterus. Conjunctivae normal. Pupils equal and react to light. Extraocular movements are intact. Mucous membranes moist. No pharyngeal erythema.  NECK: Supple. No lymphadenopathy. No JVD. No carotid bruit.  CHEST: Has no focal tenderness.  LUNGS: Bilaterally clear  to auscultation.  HEART: S1, S2 regular. No murmurs are heard. No pedal edema. Pulses are 2+.  ABDOMEN: Bowel sounds present. Soft, nontender, nondistended. No hepatosplenomegaly.  NEUROLOGIC: The patient is alert, oriented to place, person and time. Cranial nerves II through XII intact. Motor 5/5 in upper and  lower extremities.  SKIN: An ulcer in the left foot, in a dressing.   LABORATORIES: Troponin less than 0.02. UA 3+ leukocyte esterase, WBC 814, bacteria 2+.   Chest x-ray, PA and lateral: No acute cardiopulmonary disease.   CMP is completely within normal limits.   CBC is completely within normal limits.   ASSESSMENT AND PLAN: Kristina Mahoney is a 74 year old female who is sent from the [facility] for hypotension.  1. Hypotension: Concerned about more of dehydration; however, cannot exclude sepsis. The patient does not have any elevated white blood cell count. The patient does not look toxic. However, concerning about the patient's hypotension and urinary tract infection, will treat the patient with broad-spectrum antibiotics considering the patient's 2 recent courses antibiotics. Follow up with urine cultures and de-escalate the antibiotics. The patient did not have any chest pain.   2. Diabetes mellitus, insulin dependent: Continue on 10 units of Lantus.  3. EKG showed Mobitz type I AV block. Consider consulting cardiology.  4. Hypertension: Will hold all blood pressure medications for now.  5. Keep the patient on deep vein thrombosis prophylaxis with Lovenox.   TIME SPENT: 45 minutes.   ____________________________ Monica Becton, MD pv:gb D: 09/09/2013 02:51:29 ET T: 09/09/2013 04:46:49 ET JOB#: [id number]  cc: Monica Becton, MD, <Dictator> Marciano Sequin. Deborra Medina, MD Grier Mitts Shanaya Schneck MD ELECTRONICALLY SIGNED 09/20/2013 3:15

## 2015-03-12 NOTE — Discharge Summary (Signed)
PATIENT NAME:  Kristina Mahoney, Kristina Mahoney MR#:  [id number] DATE OF BIRTH:  1941-06-12  DATE OF ADMISSION:  04/05/2013 DATE OF DISCHARGE:  04/05/2013  ADMISSION DIAGNOSES:  1.  Community-acquired pneumonia.  2.  Generalized weakness.  DISCHARGE DIAGNOSES:   1.  Community-acquired pneumonia.  2.  Generalized weakness secondary to problem #1.  3.  Diabetes.   CONSULTATIONS:  None.   LABORATORY DATA AT DISCHARGE:  Sodium 135, potassium 4.2, chloride 101, bicarbonate 25, BUN 29, creatinine 1.5, glucose is 412, white blood cells 10.3, hemoglobin 11, hematocrit 33, platelets are 222.   Troponin was negative.   Chest x-ray showed bilateral pneumonia.   HOSPITAL COURSE:  A 74 year old female who presented with fever and weakness, found to have a pneumonia.  For further details, please refer to the history and physical.  1.  Community-acquired pneumonia.  The patient was admitted to the hospitalist service.  She was started on Rocephin and azithromycin.  She actually is doing well.  She is afebrile.  Her weakness has resolved.  She is not on oxygen.  I discussed with the daughter that patient would be stable for discharge.  2.  Weakness.  PT was consulted.  They recommended home healthcare.  The patient is living currently in temporary housing with friends, so daughter says outpatient physical therapy would be better and this consult was placed.  3.  Diabetes.  The patient has a follow-up appointment in July with a new PCP.  Lantus, was just increased so we continued 51 units at night and her sliding scale insulin that she takes at home.  4.  Neuropathy.  The patient does take gabapentin at home.  She was advised to make sure she checks her feet on a daily basis.   DISCHARGE MEDICATIONS: 1.  Gabapentin 600 mg 3 times daily.  2.  K-Chlor 20 mEq daily.  3.  Metoprolol 50 twice daily.  4.  Crestor 40 mg daily.  5.  Imdur.  She takes 2 tablets in the morning, one at night.  6.  Insulin 51 units subQ at bedtime.   7.  Zithromax 500 mg daily for 5 days.  8.  Lasix 40 mg, she takes 160 mg total.  9.  Aspirin 81 mg daily.   DISCHARGE DIET:  ADA diet.   DISCHARGE ACTIVITY:  As tolerated.  Home with supervision.  Discharge referral for outpatient physical therapy.    The patient has a primary care physician appointment on July 3rd.  She does not know the primary care physician, but patient will be planning to go there for her discharge.  The patient is medically stable for discharge.  Plan of care was discussed with the daughter and the patient.  Time spent approximately 35 minutes.    ____________________________ Donell Beers. Benjie Karvonen, MD spm:ea D: 04/05/2013 11:58:50 ET T: 04/06/2013 06:49:15 ET JOB#: [id number]  cc: Lynden Flemmer P. Benjie Karvonen, MD, <Dictator> Donell Beers Elyna Pangilinan MD ELECTRONICALLY SIGNED 04/07/2013 13:42

## 2015-03-12 NOTE — Discharge Summary (Signed)
PATIENT NAME:  Kristina Mahoney, Kristina Mahoney MR#:  [id number] DATE OF BIRTH:  12-23-1940  DATE OF ADMISSION:  09/09/2013 DATE OF DISCHARGE:  09/10/2013  ADMISSION DIAGNOSES:  1. Hypotension.  2. Urinary tract infection.   DISCHARGE DIAGNOSES: 1. Hypotension secondary to urinary tract infection.  2. Coagulase-negative Staphylococcus urinary tract infection.  3. Diabetes.  4. Weakness from above issues.   LABORATORY DATA:  Urine culture positive for coag-negative staph.  White blood cells 5.6, hemoglobin 11, hematocrit 32.8, platelets are 169.  Sodium 142, potassium 3.5, chloride 112, bicarbonate 22, BUN 11, creatinine 0.99, glucose is 134.   Blood culture is negative to date.   HISTORY AND HOSPITAL COURSE: A 74 year old female who has been treated as an outpatient for urinary tract infection on antibiotics as well as fluconazole, who presents with hypotension from her cardiologist's office and found to have a UTI. For further details, please refer to the H and P.   1. Hypotension secondary to urinary tract infection, dehydration and weakness, which improved with IV fluids.  2. Urinary tract infection. Urine cultures positive for coag-negative staph. At this time, she was treated on Zosyn. Will discharge her with Keflex.  3. Diabetes. The patient will continue outpatient medications.  4. Weakness, which has improved with IV fluids, antibiotics.   DISCHARGE MEDICATIONS:  1. Aspirin 81 mg daily.  2. Lyrica 50 mg b.i.d.  3. Acetaminophen/hydrocodone 325/5 mg 1 tablet t.i.d.  4. Imdur 60 mg b.i.d.  5. Lantus 10 to 15 units at bedtime.  6. NovoLog sliding scale b.i.d.  7. K-Lor 10 mEq b.i.d.  8. Crestor 10 mg at bedtime.  9. Keflex 500 mg t.i.d. for 10 days.   DISCHARGE DIET: Low sodium, ADA diet.   DISCHARGE ACTIVITY: As tolerated.   DISCHARGE FOLLOWUP: The patient will follow up with her primary care physician in 1 week for repeat urinalysis.  The patient is medically stable for discharge.    TIME SPENT: 35 minutes.  ____________________________ Donell Beers. Benjie Karvonen, MD spm:lb D: 09/10/2013 14:06:48 ET T: 09/10/2013 14:38:22 ET JOB#: [id number]  cc: Merion Caton P. Benjie Karvonen, MD, <Dictator> Marciano Sequin. Deborra Medina, MD Donell Beers Sheresa Cullop MD ELECTRONICALLY SIGNED 09/10/2013 14:57

## 2015-03-12 NOTE — H&P (Signed)
PATIENT NAME:  Kristina Mahoney, Kristina Mahoney MR#:  [id number] DATE OF BIRTH:  07/17/1941  DATE OF ADMISSION:  05/09/2013  PRIMARY CARE PHYSICIAN: Marciano Sequin. Deborra Medina, MD  REFERRING PHYSICIAN: Ahmed Prima, MD  CHIEF COMPLAINT: Weakness, dizziness and hypotension today.   HISTORY OF PRESENT ILLNESS: The patient is a 74 year old Caucasian female with a history of bilateral heel wounds, CKD, diabetes, recurrent UTI, CAD, presented to the ED with weakness, dizziness, hypotension today after the patient went to wound care and was sent to Dr. Lucky Cowboy and Dr. Nino Parsley office due to possible  PVD. The patient felt weak and dizzy, was noted to have a low blood pressure at 80s, then was sent to ED for further evaluation. The patient's blood pressure was 77/46 in ED, was treated with a normal saline bolus. The patient denies any fever, but has some mild chills. She has weakness, dizziness, headache, but denies any chest pain or palpitations. No cough, sputum or shortness of breath. The patient has no leg edema. The patient was suspected to have septic shock by Dr. Thomasene Lot. A blood culture was drawn and the patient.  is being treated with vancomycin and Zosyn.  PAST MEDICAL HISTORY:  1.  Diabetes. 2.  Recurrent UTI. 3.  CAD, status post stent and CABG. 4.  Bilateral heel wound 3 weeks ago.  5.  Suspected PVD.   SOCIAL HISTORY: The patient is living with her daughter. No smoking, alcohol drinking or illicit drugs.   FAMILY HISTORY: Diabetes, congestive heart failure, MI in mother.   ALLERGIES: TEGRETOL, TRILEPTAL.  HOME MEDICATIONS:  Gabapentin 600 mg p.o. t.i.d., Klor-Con 20 mEq once a day, Lopressor 50 mg p.o. b.i.d., Crestor 40 mg p.o. once a day at bedtime, isosorbide mononitrate 60 mg p.o. daily, Lantus 51 units subcutaneously at bedtime, Lasix p.o. daily, aspirin 81 mg p.o. daily. According to the patient's daughter, the patient is taking Lopressor not for hypertension but for mitral valve disease, and also the patient  is taking Lasix due to some leg edema.  REVIEW OF SYSTEMS:   CONSTITUTIONAL: The patient denies any fever, but has chills, generalized weakness, poor oral intake.  EYES: No double vision or blurred vision.  EARS, NOSE, THROAT: No postnasal drip, slurred speech or dysphagia. No epistaxis.  CARDIOVASCULAR: No chest pain, palpitations, orthopnea or nocturnal dyspnea. No leg edema.  PULMONARY: No cough, sputum, shortness of breath or hemoptysis.  GASTROINTESTINAL: No abdominal pain, nausea, vomiting or diarrhea. No melena or bloody stool.  GENITOURINARY: No dysuria, hematuria or incontinence.  SKIN: No rash or jaundice.  HEMATOLOGIC: No easy bruising or bleeding.  ENDOCRINE: No polyuria, polydipsia, heat or cold intolerance.  SKIN: No rash or jaundice.  NEUROLOGIC: No syncope, loss of consciousness or seizure, but has headache, dizziness and weakness.   PHYSICAL EXAMINATION: VITAL SIGNS: Temperature 96, blood pressure 77/46, pulse 76. Blood pressure  just now was 126/70.  GENERAL: The patient is alert, awake, oriented, in no acute distress.  HEENT: Pupils round, equal, reactive to light and accommodation. Moist oral mucosa. Clear oropharynx. NECK: Supple. No JVD or carotid bruits. No lymphadenopathy. No thyromegaly.  CARDIOVASCULAR: S1, S2, regular rate and rhythm. No murmurs or gallops.  PULMONARY: Bilateral air entry. No wheezing or rales. No use of accessory muscles to breathe.  ABDOMEN: Soft. No distention or tenderness. No organomegaly. Bowel sounds present.  EXTREMITIES: No edema, clubbing or cyanosis. No calf tenderness. Bilateral heel wounds in dressing. No tenderness. No erythema. No swelling. Bilateral pedal pulses: It is difficult  to palpate whether the patient has bilateral pedal pulses.  SKIN: No rash or jaundice.  NEUROLOGIC: A and O x 3. No focal deficit. Power 3 to 4 out of 5 bilateral lower extremities. Sensation intact.   LABORATORY AND DIAGNOSTIC DATA: Urinalysis is  negative. WBC 11.3, hemoglobin 11.3, platelets 385. Glucose 251, BUN 30, creatinine 1.46, sodium 133, potassium 3.9, chloride 98, bicarbonate 22. Troponin less than 0.02. EKG showed sinus rhythm with marked sinus arrhythmia with a first-degree AV block at 62 beats per minute, right bundle branch block, left anterior fascicular block.   IMPRESSIONS:  1.  Hypotension.  2.  Hyponatremia.  3.  Bilateral heel wounds.  4.  Possible peripheral vascular disease.  5.  Diabetes, uncontrolled.  6.  Chronic kidney disease stage III.  7.  Coronary artery disease.   PLAN OF TREATMENT: 1.  The patient will be admitted to medical floor. We will continue normal saline IV. Follow up BMP, blood culture, CBC.  2.  We will give vancomycin and  Zosyn to cover infection or sepsis. Antibiotics may be discontinued if no evidence of sepsis or infection. Please follow up blood culture, CBC.  3.  We will get a wound care consult.  4.  For diabetes, we will start sliding scale. Continue Lantus.  5.  For CAD, we will start aspirin and statin.  6.  GI and DVT prophylaxis.  7.  I discussed the patient's condition and the plan of treatment with the patient and patient's daughters.   CODE STATUS: The patient is a full code.   TIME SPENT: About 57 minutes.    ____________________________ Demetrios Loll, MD qc:jm D: 05/09/2013 20:12:36 ET T: 05/09/2013 20:33:16 ET JOB#: [id number]  cc: Demetrios Loll, MD, <Dictator> Demetrios Loll MD ELECTRONICALLY SIGNED 05/10/2013 14:46

## 2015-03-12 NOTE — H&P (Signed)
PATIENT NAME:  Kristina Mahoney, Kristina Mahoney MR#:  [id number] DATE OF BIRTH:  05-07-1941  DATE OF ADMISSION:  04/05/2013  PRIMARY CARE PHYSICIAN: Dr. Lindalou Hose in [place], [place].   REFERRING PHYSICIAN: Blair Heys. Cheri Guppy, MD   CHIEF COMPLAINT: Generalized weakness and fever.   HISTORY OF PRESENT ILLNESS: Kristina Mahoney is a 74 year old white female with a past medical history of diabetes mellitus, insulin dependent, coronary artery disease, status post CABG, multiple urinary tract infections in the past, who comes to the Emergency Department with fever and generalized weakness since this evening. The patient was doing well, in her usual state of health this morning. Around 4:00 to 5:00 in the evening, started to experience chills. The patient's daughter noted her somewhat in altered mental status. Checked her temperatures and was noted to have fever of 102. Considering this, the patient was brought to the Emergency Department. Workup in the Emergency Department with chest x-ray was concerning about left lower lobe pneumonia. Has mild elevation of the white blood cell count. Currently, urinalysis is pending. Per family, the patient denied having any cough, shortness of breath. The patient's initial oxygen saturations were 93% on room air. At baseline, walks minimally.   PAST MEDICAL HISTORY:  1. Diabetes mellitus, insulin dependent.  2. Recurrent UTIs.  3. Coronary artery disease, status post MI in 2001, requiring stent placement at that time and coronary artery bypass grafting in 2004.   PAST SURGICAL HISTORY: Coronary artery bypass grafting.   ALLERGIES: No known drug allergies.   HOME MEDICATIONS:  1. NovoLog FlexPen.  2. Metoprolol 50 mg 1 tablet 2 times a day.  3. Klor-Con 20 mEq daily.  4. Imdur daily.  5. Gabapentin 600 mg 1 tablet 3 times a day.  6. Lasix daily.  7. Crestor 40 mg once a day.   SOCIAL HISTORY: No history of smoking, drinking alcohol or using illicit drugs. Lives with her daughter.    FAMILY HISTORY: A strong family history of diabetes mellitus, congestive heart failure, MI in mother.   REVIEW OF SYSTEMS:  CONSTITUTIONAL: Generalized weakness. No loss of weight.  EYES: No change in vision.  ENT: Ears: No change in hearing. No sore throat.  RESPIRATORY: No cough, shortness of breath.  CARDIOVASCULAR: No chest pain, palpitations. Has pedal edema, improved with Lasix.  GASTROINTESTINAL: Has a good appetite. Usually constipated. Bowel movements once in 2 to 3 days.  GENITOURINARY: No dysuria or hematuria.  ENDOCRINE: No polyuria or polydipsia. Has history of diabetes mellitus.  SKIN: No rash or lesions.  MUSCULOSKELETAL: Has generalized body aches.  NEUROLOGIC: No weakness or numbness in any part of the body.   PHYSICAL EXAMINATION:  GENERAL: This is a well-built, well-nourished, age-appropriate female lying down in the bed, looks lethargic.  VITAL SIGNS: Temperature 101.8, pulse 102, blood pressure 118/55, respiratory rate of 20, oxygen saturation is 96% on 2 liters of oxygen.  HEENT: Head normocephalic, atraumatic. Eyes: No scleral icterus. Conjunctivae normal. Pupils equal and reactive to light. Mucous membranes dry. No pharyngeal erythema.  NECK: Supple. No lymphadenopathy. No JVD. No carotid bruit. No thyromegaly.  CHEST: Has no focal tenderness. Crackles are heard in both bases, left greater than the right. Some bronchial breath sounds.  HEART: S1 and S2, regular, tachycardia. Lower extremities have dermatitis consistent with chronic venous stasis changes. Pulses 2+. ABDOMEN: Bowel sounds present. Soft, nontender, nondistended. Obese abdomen. No hepatosplenomegaly.  MUSCULOSKELETAL: Good range of motion in all the extremities.  SKIN: No rash or lesions.  LYMPHATIC: No axillary or  inguinal lymphadenopathy.  NEUROLOGIC: The patient is alert. Looks lethargic. Oriented to place, person and time. Cranial nerves II through XII intact. Motor 5/5 in upper and lower  extremities. Has decreased sensation in the lower extremities.   LABORATORY DATA: Glucose 412, BUN 29, creatinine of 1.57. CBC: WBC of 10.3, hemoglobin 11.1. Troponin less than 0.02.   ASSESSMENT AND PLAN: Kristina Mahoney is a 74 year old female who comes to the Emergency Department with fever of 1-day duration.   1. Left lower lobe pneumonia. Will obtain blood cultures. Keep the patient on Rocephin and Zithromax.  2. Diabetes mellitus, poorly controlled. Will obtain hemoglobin A1c. The patient's current blood sugars are greater than 400. Continue with home dose of the Novolin N and continue with the sliding scale insulin.  3. Acute renal insufficiency. Will hold the Lasix for now. Will continue with IV fluids.  4. Debility. Will involve the physical therapy, occupational therapy.  5. History of coronary artery disease status post coronary artery bypass grafting. Continue the home medications of aspirin, metoprolol and Crestor.  6. Keep the patient on deep vein thrombosis prophylaxis with Lovenox.   TIME SPENT: 45 minutes.   ____________________________ Monica Becton, MD pv:OSi D: 04/05/2013 03:34:03 ET T: 04/05/2013 05:50:19 ET JOB#: [id number]  cc: Monica Becton, MD, <Dictator> Monica Becton MD ELECTRONICALLY SIGNED 04/08/2013 7:43

## 2015-03-12 NOTE — Op Note (Signed)
PATIENT NAME:  GLENICE, CICCONE MR#:  [id number] DATE OF BIRTH:  August 14, 1941  DATE OF PROCEDURE:  06/30/2013  PREOPERATIVE DIAGNOSES: 1.  Peripheral arterial disease with nonhealing ulceration of left lower extremity.  2.  Diabetes.  3.  Coronary artery disease.   POSTOPERATIVE DIAGNOSES:  1.  Peripheral arterial disease with nonhealing ulceration of left lower extremity.  2.  Diabetes.  3.  Coronary artery disease.   PROCEDURES: 1.  Ultrasound guidance for vascular access, right femoral artery.  2.  Catheter placement to left popliteal artery from right femoral approach.  3.  Aortogram and selective left lower extremity angiogram.  4.  Percutaneous transluminal angioplasty of left superficial femoral artery with 5 mm diameter angioplasty balloon.  5.  Self-expanding stent placement with 6 mm diameter self-expanding stent to superficial femoral artery for stenosis and dissection after angioplasty.  6.  StarClose closure device right femoral artery.   SURGEON: Leotis Pain, M.D.   ANESTHESIA: Local with moderate conscious sedation.   ESTIMATED BLOOD LOSS: Approximately 25 mL.  FLUOROSCOPY TIME:  Approximately 5 minutes.  CONTRAST USED:  70 mL.   INDICATION FOR PROCEDURE: A 74 year old white female who I have seen in the office for nonhealing wounds to the lower extremities. Her right lower extremity wound went on to heal. Her left lower extremity wound has persisted and not improved. Noninvasive studies had shown some diminished perfusion, and she is brought in for angiogram for further evaluation and potential treatment. Risks and benefits were discussed. Informed consent is obtained.   DESCRIPTION OF PROCEDURE: The patient is brought to the vascular and interventional radiology suite. Groins were shaved and prepped and a sterile surgical field was created. Due to body habitus ultrasound was used to visualize a patent right femoral artery. It was accessed under direct ultrasound guidance  without difficulty with a Seldinger needle. A J-wire and 5-French sheath were placed. A pigtail catheter was placed in the aorta at the L1-L2 level and AP aortogram was performed.  This showed no flow-limiting stenosis in the aortoiliac segments. The right renal artery had a very low takeoff not far above the aortic bifurcation. The left renal artery was at its normal location. Both were widely patent. I then hooked the aortic bifurcation and advanced to the left femoral head and selective left lower extremity angiogram was then performed. This demonstrated a superficial femoral artery, which was somewhat diffusely diseased throughout the mid and distal segments that was narrowed and there appeared to be several areas of greater than 50% stenosis within this diffuse disease. The popliteal artery was then patent. The anterior tibial artery was the best runoff to the foot, and at the level of the ankle it collateralized to what appeared to be the peroneal and posterior tibial arteries as well. The patient was systemically heparinized. A 6-French Ansell sheath was placed over the [employer] wire, and I used the [employer] wire and a Kumpe catheter to cross the SFA stenoses and confirm intraluminal flow in the popliteal artery. I then replaced the wire. Angioplasty was performed with a 5 mm diameter angioplasty balloon of the SFA.  Multiple areas of wastes were seen throughout this segment and a long angioplasty balloon was used. Following angioplasty there was dissection and some residual stenosis in multiple segments that appeared flow limiting and so I elected to place a stent. A 6 mm diameter x 27 cm in length self-expanding stent was placed and ironed out with a 5 mm balloon with an excellent  angiographic completion result. At this point, I elected to terminate the procedure. The sheath was pulled back to the ipsilateral external cautery and oblique arteriogram was performed. StarClose closure device  was deployed in the usual fashion with excellent hemostatic result. The patient was then taken to the recovery room in stable condition having tolerated the procedure well. ____________________________ Algernon Huxley, MD jsd:sb D: 06/30/2013 09:10:54 ET T: 06/30/2013 10:31:58 ET JOB#: [id number]  cc: Algernon Huxley, MD, <Dictator> Algernon Huxley MD ELECTRONICALLY SIGNED 07/07/2013 16:07

## 2015-03-12 NOTE — Discharge Summary (Signed)
PATIENT NAME:  Kristina Mahoney, Kristina Mahoney MR#:  [id number] DATE OF BIRTH:  1940-12-10  DATE OF ADMISSION:  05/09/2013 DATE OF DISCHARGE:  05/12/2013  PRIMARY CARE PHYSICIAN: Dr. Arnette Norris at [facility].    CONSULTANT: Dr. Cleda Mccreedy from podiatry.   CHIEF COMPLAINT: Weakness, dizziness and low blood pressure.   DISCHARGE DIAGNOSES: 1.  Hypertension likely from volume depletion from  over diuresis with Lasix.  2.  History of recurrent urinary tract infections.  3.  Diabetes.  4.  Coronary artery disease status post coronary artery bypass graft.  5.  History of bilateral heel wounds that is getting treated at the wound care center.  6.  Peripheral vascular disease,  being evaluated with Dr. Lucky Cowboy.   DISCHARGE MEDICATIONS: Gabapentin 600 mg 3 times a day, Crestor 40 mg at bedtime, aspirin 81 mg daily, Lantus 51 unit once a day, Klor-Con 10 mEq 2 tabs once a day and metoprolol 25 mg 3 times a day.   DIET: Low sodium, low fat, low cholesterol, ADA diet.   ACTIVITY: As tolerated.   FOLLOWUP: Please follow with PCP on 06/02/2013 at 12:00 p.m. and go wound care center tomorrow as previously scheduled and withhold taking Lasix until you are seen by your PCP.   CODE STATUS: The patient is full code.   TOTAL TIME SPENT: 35 minutes.   SIGNIFICANT LABS AND IMAGING: Initial BUN 30, creatinine was 1.46, sodium 133. Last creatinine was 1.3. Potassium on arrival 3.9. LFTs on arrival showed alkaline phosphatase of 169, total protein 8.6, otherwise within normal limits. Troponin negative. WBC 11.3, hemoglobin 11.3, platelets 385.   Blood cultures on arrival: No growth to date.   Urinalysis not suggestive of infection.   HISTORY OF PRESENT ILLNESS AND HOSPITAL COURSE: For full details of H and P, please see the dictation on 05/09/2013 by Dr. Bridgett Larsson, but briefly this is a pleasant 74 year old female with CKD, diabetes, recurrent UTIs, coronary artery disease status post CABG who has moved from [place], who came in for a low blood pressure, dizziness, weakness and was noted to have hypotension on arrival and suspected shock. She also had some mild chills. She was admitted to the hospitalist service, started on a vancomycin and Zosyn and IV fluids. She was admitted to a medical floor for of suspected sepsis. Blood cultures were sent. Urinalysis was sent. Urinalysis was not suggestive of infection. She had no urinary tract infection symptoms. She had a no fever and had no significant leukocytosis. Hypotension was resolved with IV fluids and this was likely secondary to overdiuresis as she is on rather high doses of diuretics at home. She has no history of heart failure per her and the fluids were just given to her for her chronic lower extremity edema, which likely also need to be further worked up. She had stated that she takes about 120 mg of Lasix as an outpatient in the morning about 80 or so at night. I think that dried her out and caused a mild hyponatremia. It is possible that her creatinine is her baseline and she had chronic kidney disease stage III. Her sodium did improve with fluids. She did have some hypokalemia, which was repleted. She was also seen by Dr. Cleda Mccreedy from podiatry who believed that the ulcers were not infected and at this point off of antibiotics for a day. Her vitals are stable without significant hypotension and she will be discharged with outpatient  follow-up. In regards to her heel wounds,  she will follow up with the wound care center tomorrow. At this point, she will be discharged with outpatient follow-up and she will be staying with some friends of her daughter's and her daughter will be  there as well until she gets her home furnished and her furniture and delivered from [facility].    ____________________________ Vivien Presto, MD sa:cc D: 05/12/2013 16:25:37 ET T: 05/12/2013 23:37:30 ET JOB#: [id number]  cc: Vivien Presto, MD, <Dictator> Marciano Sequin. Deborra Medina,  MD Vivien Presto MD ELECTRONICALLY SIGNED 05/18/2013 22:16

## 2015-03-12 NOTE — Consult Note (Signed)
PATIENT NAME:  Kristina Mahoney, Kristina Mahoney MR#:  [id number] DATE OF BIRTH:  [birth year]  DATE OF CONSULTATION:  05/10/2013  REFERRING PHYSICIAN:   CONSULTING PHYSICIAN:  Sharlotte Alamo, DPM  REASON FOR CONSULTATION:  This is a 74 year old diabetic (Dictation Anomaly) female with recent evaluation for peripheral vascular disease who became hypotensive and dizzy in Dr. Bunnie Domino office and was sent to the Emergency Department for evaluation.  She did have a low blood pressure and was admitted.  The patient states that she did have an injury to the back of her left heel about three weeks ago where an object hit the back of her heel.  States that the right heel has had an ulcer on it just from being in bed.   PAST MEDICAL HISTORY: 1.  Insulin-requiring diabetes.  2.  Recurrent UTI.  3.  Coronary artery disease status post stent and CABG.  4.  Bilateral heel wound three weeks ago.  5.  Suspected PVD.   SOCIAL HISTORY:  Recently moved to this area.  Living with her daughter.  Denies any alcohol or tobacco use.   FAMILY HISTORY:  Diabetes, heart disease.   MEDICATIONS:  Gabapentin 600 mg by mouth 3 times daily, Klor-Con 20 mEq once a day, Lopressor 50 mg by mouth twice daily, Crestor 40 mg by mouth once a day at bedtime, isosorbide mononitrate 60 mg by mouth daily, Lantus insulin 51 units subcutaneous at bedtime, Lasix by mouth daily, aspirin 81 mg by mouth daily.   ALLERGIES:  TEGRETOL AND TRILEPTAL.   REVIEW OF SYSTEMS:  The patient does not specifically relate any fever, but states she is generally always cold.  Does not specifically relate recent chills.  Does have some generalized weakness.  Does have some swelling and redness in the left heel area.  Injury three weeks ago.  Does relate numbness and tingling in the extremities from her diabetes.   PHYSICAL EXAMINATION: VASCULAR:  DP and PT pulses could be palpated very weak 1 over 4 bilateral.  Capillary filling time appears intact.  NEUROLOGICAL:  There is loss of  protective threshold with monofilament wire distally in the digits.  Proprioception is impaired.  INTEGUMENT:  Skin is warm, dry and somewhat atrophic.  Some diminished hair growth.  Bilateral edema.  Some mild diffuse nonspecific erythema in the left leg at the level of the ankle.  There is a large posterior heel ulcer approximately 4 cm in diameter on the left heel.  Some devitalized tissue around the periphery, but there is no clear purulence or abscess.  An ulceration, dry and stable in the posterior aspect of the right heel approximately 2.5 x 3 cm with an underlying granular base.  Also, an ulceration beneath the left first metatarsal approximately 1.5 cm diameter which does go to the deep superficial layers.  None of these appear to be clinically infected.  MUSCULOSKELETAL:  Stiff range of motion of the pedal joints.  Generalized muscle weakness.   ASSESSMENT:  1.  Bilateral heel ulcerations, not currently infected.  2.  Diabetes with associated neuropathy.  3.  Evidence for peripheral vascular disease.   PLAN:  We will continue local wound care and pressure relief boots.  At this point, we should be able to follow her up outpatient.  She states she does have an appointment with the [facility] on Tuesday.  She also stated that Dr. Lucky Cowboy was going to evaluate for possible intervention on her left leg which appeared to have some degree of  impairment.  For now, we will follow the patient outpatient or re-consulted if needed inpatient.    ____________________________ Sharlotte Alamo, DPM tc:ea D: 05/10/2013 19:18:56 ET T: 05/10/2013 20:35:22 ET JOB#: [id number]  cc: Sharlotte Alamo, DPM, <Dictator> Ezra Denne DPM ELECTRONICALLY SIGNED 05/13/2013 14:15

## 2015-04-29 ENCOUNTER — Encounter: Payer: Self-pay | Admitting: *Deleted

## 2015-07-26 IMAGING — XA IR VASCULAR PROCEDURE
13 of 15 series · 15 of 21 positions shown · IV contrast (IODINE)
Comparison: none

[Series 1: care aorta · 1 of 2 slices shown]
[im 1/2]
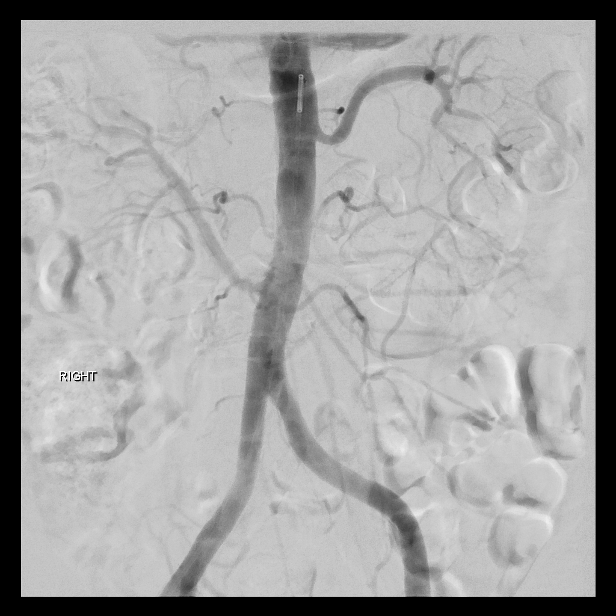

[Series 2: care sfa · 1 of 1 slices shown (1 of 11)]
[im 1/1]
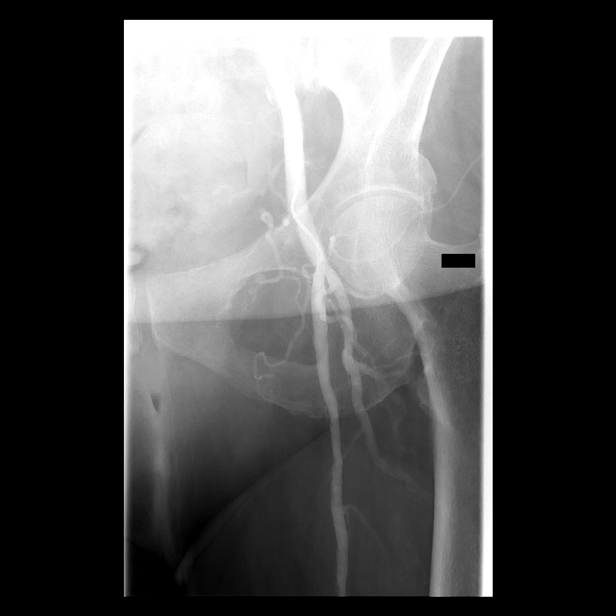

[Series 4: care sfa · 2 of 2 slices shown (2 of 11)]
[im 1/2]
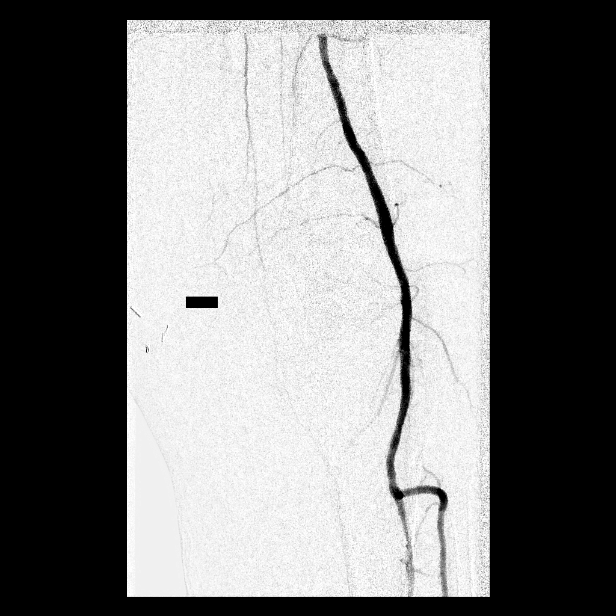
[im 2/2]
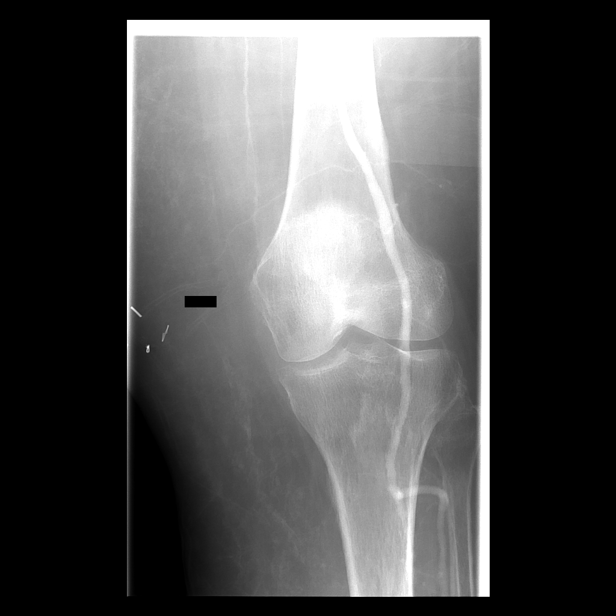

[Series 6: care sfa · 1 of 1 slices shown (3 of 11)]
[im 1/1]
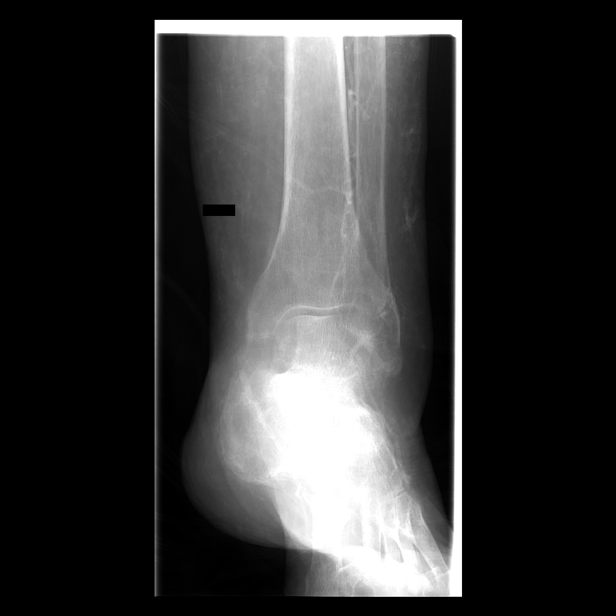

[Series 7: care sfa · 1 of 2 slices shown (4 of 11)]
[im 1/2]
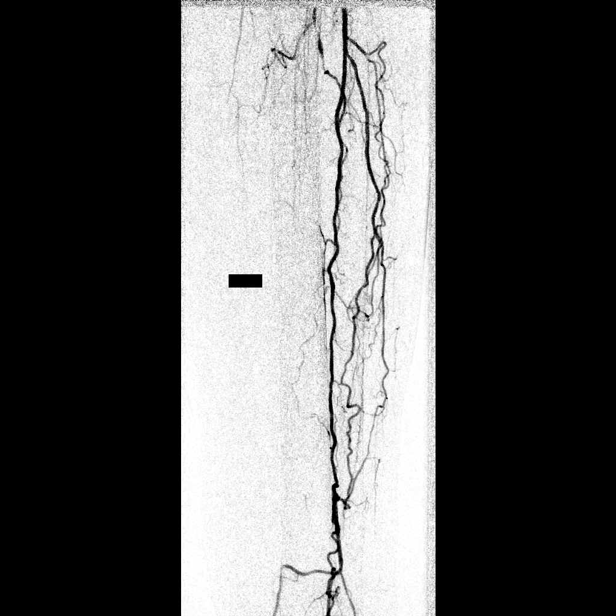

[Series 8: care sfa · 2 of 2 slices shown (5 of 11)]
[im 1/2]
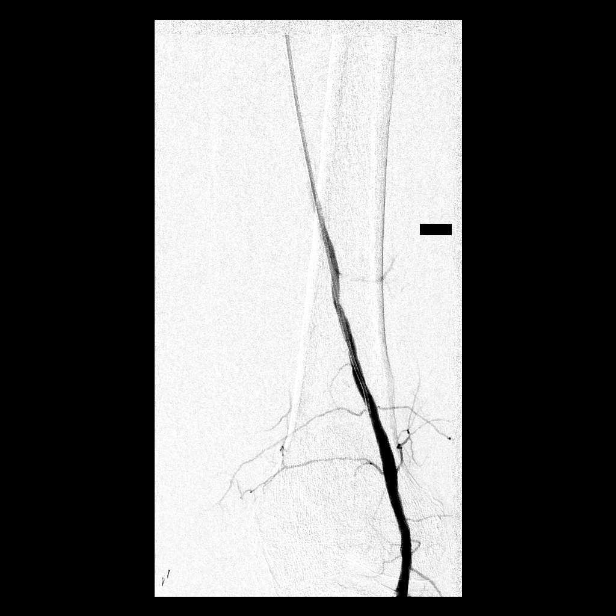
[im 2/2]
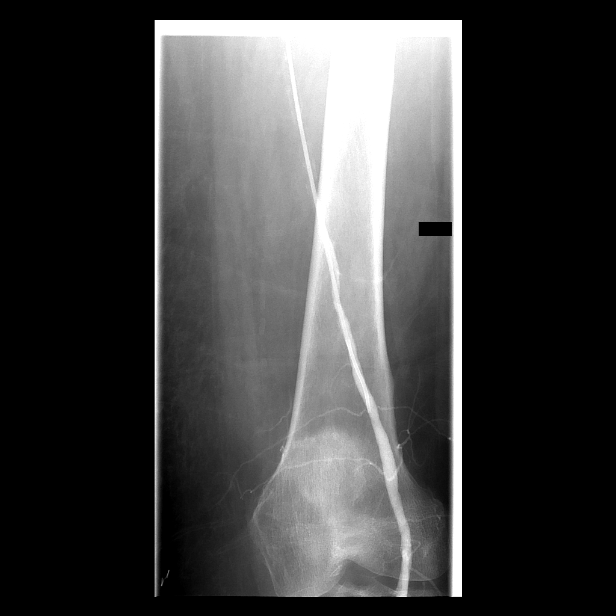

[Series 9: care sfa · 1 of 2 slices shown (6 of 11)]
[im 1/2]
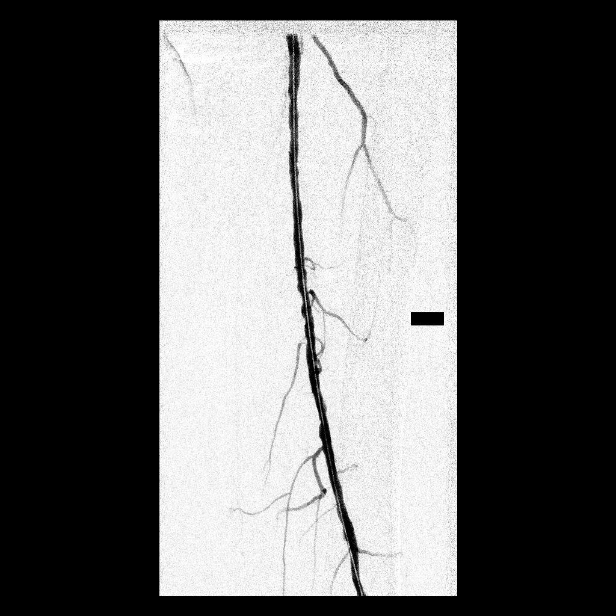

[Series 10: fl - angio · 1 of 1 slices shown]
[im 1/1]
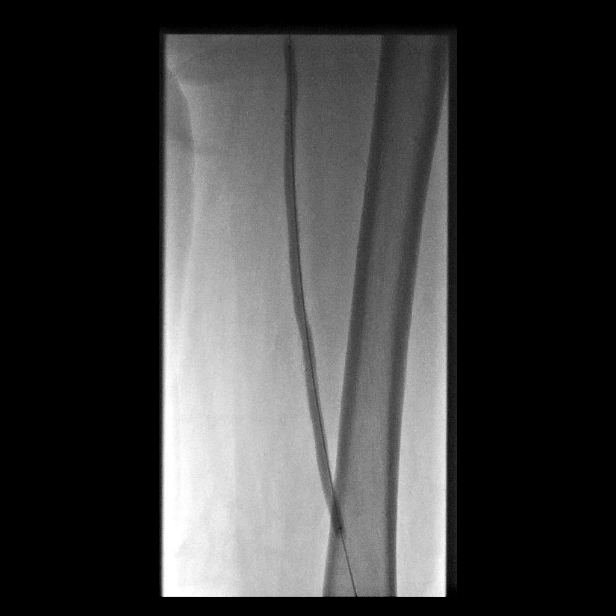

[Series 11: care sfa · 1 of 1 slices shown (7 of 11)]
[im 1/1]
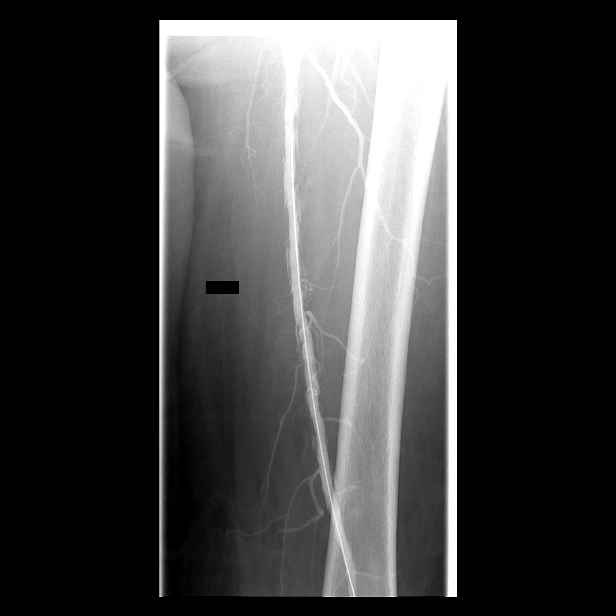

[Series 13: care sfa · 1 of 1 slices shown (8 of 11)]
[im 1/1]
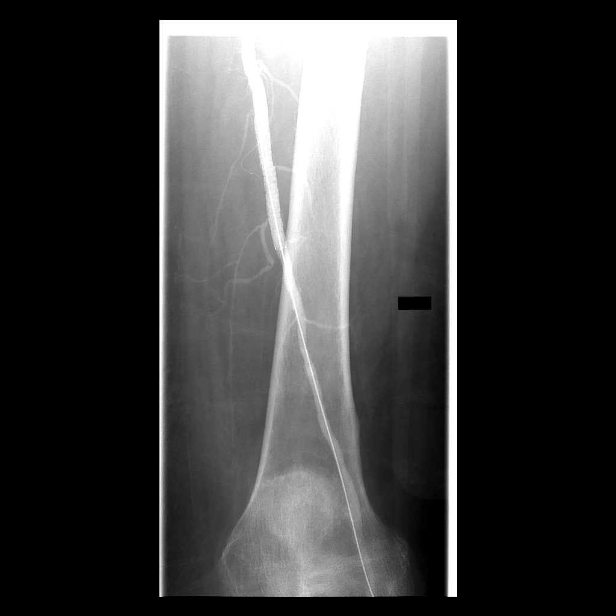

[Series 14: care sfa · 1 of 1 slices shown (9 of 11)]
[im 1/1]
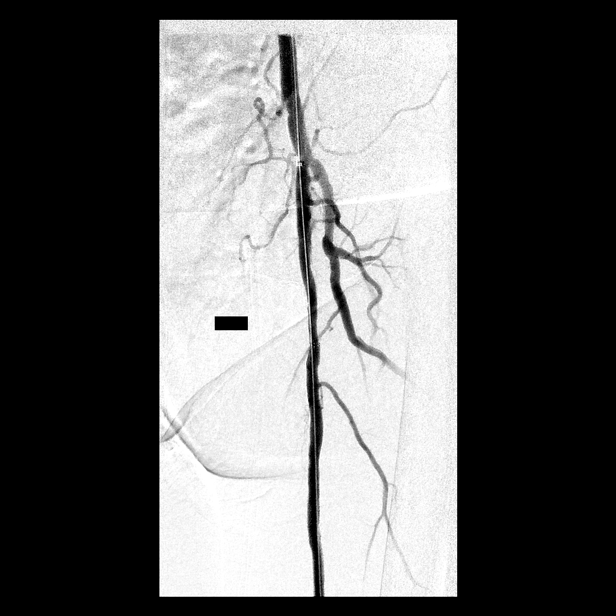

[Series 15: care sfa · 1 of 1 slices shown (10 of 11)]
[im 1/1]
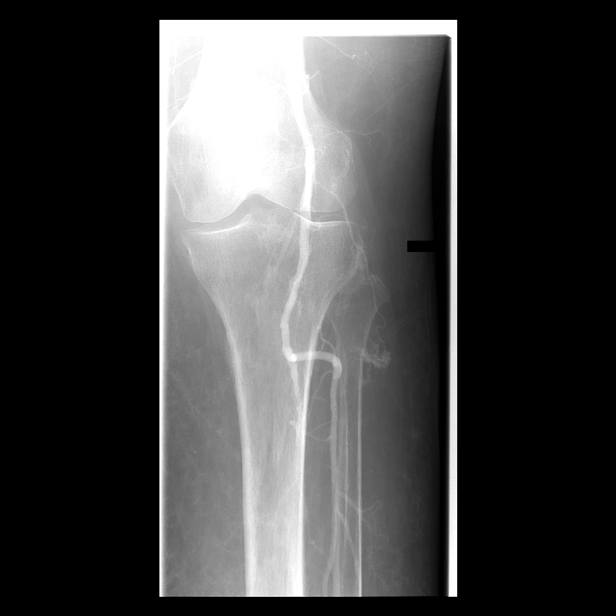

[Series 16: care sfa · 1 of 2 slices shown (11 of 11)]
[im 2/2]
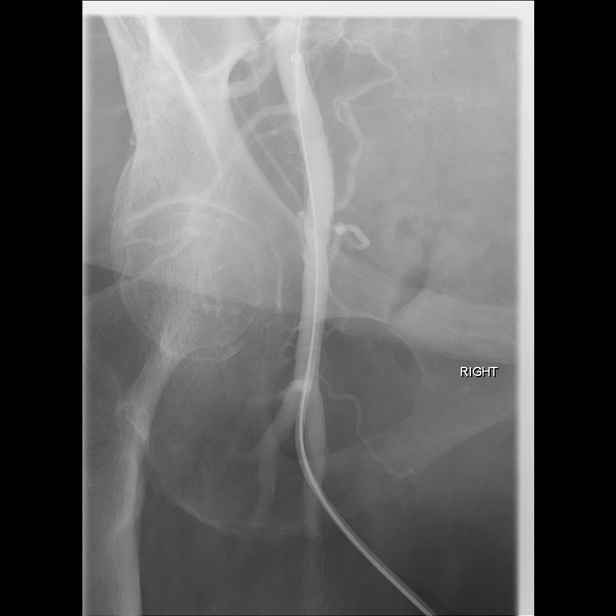

[15 of 21 positions shown; findings below may reference images not displayed]

IMAGES IMPORTED FROM THE [employer] WORKFLOW SYSTEM
NO DICTATION FOR STUDY
